# Patient Record
Sex: Female | Born: 1965 | Race: White | Hispanic: Yes | Marital: Married | Smoking: Former smoker
Health system: Southern US, Community
[De-identification: ages and names within clinical notes are randomized; demographics above are authoritative.]

## PROBLEM LIST (undated history)

## (undated) DIAGNOSIS — E079 Disorder of thyroid, unspecified: Secondary | ICD-10-CM

## (undated) DIAGNOSIS — R2230 Localized swelling, mass and lump, unspecified upper limb: Secondary | ICD-10-CM

## (undated) DIAGNOSIS — D649 Anemia, unspecified: Secondary | ICD-10-CM

## (undated) DIAGNOSIS — E041 Nontoxic single thyroid nodule: Secondary | ICD-10-CM

## (undated) DIAGNOSIS — M1990 Unspecified osteoarthritis, unspecified site: Secondary | ICD-10-CM

## (undated) DIAGNOSIS — K9041 Non-celiac gluten sensitivity: Secondary | ICD-10-CM

## (undated) HISTORY — DX: Anemia, unspecified: D64.9

## (undated) HISTORY — DX: Localized swelling, mass and lump, unspecified upper limb: R22.30

## (undated) HISTORY — DX: Non-celiac gluten sensitivity: K90.41

## (undated) HISTORY — DX: Disorder of thyroid, unspecified: E07.9

## (undated) HISTORY — PX: OTHER SURGICAL HISTORY: SHX169

## (undated) HISTORY — PX: TUBAL LIGATION: SHX77

## (undated) HISTORY — PX: TONSILLECTOMY: SUR1361

## (undated) HISTORY — DX: Unspecified osteoarthritis, unspecified site: M19.90

---

## 2005-08-24 ENCOUNTER — Other Ambulatory Visit: Admission: RE | Admit: 2005-08-24 | Discharge: 2005-08-24 | Payer: Self-pay | Admitting: Family Medicine

## 2005-08-24 ENCOUNTER — Ambulatory Visit: Payer: Self-pay | Admitting: Family Medicine

## 2005-09-22 ENCOUNTER — Ambulatory Visit: Payer: Self-pay | Admitting: Family Medicine

## 2006-10-30 ENCOUNTER — Encounter: Payer: Self-pay | Admitting: Family Medicine

## 2007-05-27 ENCOUNTER — Ambulatory Visit: Payer: Self-pay | Admitting: Family Medicine

## 2007-05-27 ENCOUNTER — Other Ambulatory Visit: Admission: RE | Admit: 2007-05-27 | Discharge: 2007-05-27 | Payer: Self-pay | Admitting: Family Medicine

## 2007-05-27 ENCOUNTER — Encounter: Payer: Self-pay | Admitting: Family Medicine

## 2007-05-27 DIAGNOSIS — R143 Flatulence: Secondary | ICD-10-CM

## 2007-05-27 DIAGNOSIS — R142 Eructation: Secondary | ICD-10-CM

## 2007-05-27 DIAGNOSIS — K5900 Constipation, unspecified: Secondary | ICD-10-CM | POA: Insufficient documentation

## 2007-05-27 DIAGNOSIS — R141 Gas pain: Secondary | ICD-10-CM | POA: Insufficient documentation

## 2007-05-27 DIAGNOSIS — J309 Allergic rhinitis, unspecified: Secondary | ICD-10-CM | POA: Insufficient documentation

## 2007-05-27 LAB — CONVERTED CEMR LAB
Blood in Urine, dipstick: NEGATIVE
Ketones, urine, test strip: NEGATIVE
Pap Smear: NORMAL
Protein, U semiquant: NEGATIVE
WBC Urine, dipstick: NEGATIVE

## 2007-05-31 ENCOUNTER — Encounter: Payer: Self-pay | Admitting: Family Medicine

## 2007-05-31 LAB — CONVERTED CEMR LAB
ALT: 13 units/L (ref 0–35)
CO2: 25 meq/L (ref 19–32)
Calcium: 8.8 mg/dL (ref 8.4–10.5)
Chloride: 104 meq/L (ref 96–112)
Cholesterol: 169 mg/dL (ref 0–200)
Glucose, Bld: 83 mg/dL (ref 70–99)
HCT: 32.1 % — ABNORMAL LOW (ref 36.0–46.0)
HDL: 71 mg/dL (ref >39)
Hemoglobin: 8.9 g/dL — ABNORMAL LOW (ref 12.0–15.0)
LDL Cholesterol: 86 mg/dL (ref 0–99)
MCHC: 27.7 g/dL — ABNORMAL LOW (ref 30.0–36.0)
Platelets: 556 10*3/uL — ABNORMAL HIGH (ref 150–400)
Potassium: 4.6 meq/L (ref 3.5–5.3)
RBC: 4.52 M/uL (ref 3.87–5.11)
RDW: 18.5 % — ABNORMAL HIGH (ref 11.5–14.0)
Sodium: 139 meq/L (ref 135–145)
T3, Free: 2.8 pg/mL (ref 2.3–4.2)
Total Protein: 7 g/dL (ref 6.0–8.3)
Triglycerides: 61 mg/dL (ref <150)
WBC: 4.8 10*3/uL (ref 4.0–10.5)

## 2007-06-03 ENCOUNTER — Encounter: Payer: Self-pay | Admitting: Family Medicine

## 2007-06-04 ENCOUNTER — Telehealth (INDEPENDENT_AMBULATORY_CARE_PROVIDER_SITE_OTHER): Payer: Self-pay | Admitting: *Deleted

## 2007-06-04 ENCOUNTER — Encounter: Payer: Self-pay | Admitting: Family Medicine

## 2007-06-04 LAB — CONVERTED CEMR LAB
Saturation Ratios: 4 % — ABNORMAL LOW (ref 20–55)
UIBC: 401 ug/dL

## 2007-07-17 ENCOUNTER — Telehealth: Payer: Self-pay | Admitting: Family Medicine

## 2007-07-17 DIAGNOSIS — D509 Iron deficiency anemia, unspecified: Secondary | ICD-10-CM | POA: Insufficient documentation

## 2007-08-19 ENCOUNTER — Encounter: Payer: Self-pay | Admitting: Family Medicine

## 2007-09-13 ENCOUNTER — Encounter: Payer: Self-pay | Admitting: Family Medicine

## 2007-09-13 LAB — HM COLONOSCOPY: HM Colonoscopy: NORMAL

## 2007-11-12 ENCOUNTER — Encounter: Payer: Self-pay | Admitting: Family Medicine

## 2008-02-05 ENCOUNTER — Encounter: Payer: Self-pay | Admitting: Family Medicine

## 2008-03-17 ENCOUNTER — Ambulatory Visit: Payer: Self-pay | Admitting: Family Medicine

## 2008-03-25 ENCOUNTER — Telehealth: Payer: Self-pay | Admitting: Family Medicine

## 2008-03-30 ENCOUNTER — Encounter: Payer: Self-pay | Admitting: Family Medicine

## 2008-04-07 LAB — CONVERTED CEMR LAB
CO2: 24 meq/L (ref 19–32)
Creatinine, Ser: 0.76 mg/dL (ref 0.40–1.20)
HDL: 78 mg/dL (ref >39)
MCV: 94.1 fL (ref 78.0–100.0)
Potassium: 4 meq/L (ref 3.5–5.3)
Total CHOL/HDL Ratio: 2.3
VLDL: 11 mg/dL (ref 0–40)
WBC: 5.9 10*3/uL (ref 4.0–10.5)

## 2008-05-25 ENCOUNTER — Ambulatory Visit: Payer: Self-pay | Admitting: Family Medicine

## 2008-05-25 DIAGNOSIS — R229 Localized swelling, mass and lump, unspecified: Secondary | ICD-10-CM | POA: Insufficient documentation

## 2008-05-25 DIAGNOSIS — M25559 Pain in unspecified hip: Secondary | ICD-10-CM | POA: Insufficient documentation

## 2008-05-27 ENCOUNTER — Encounter: Payer: Self-pay | Admitting: Family Medicine

## 2008-05-27 ENCOUNTER — Telehealth: Payer: Self-pay | Admitting: Family Medicine

## 2008-08-25 ENCOUNTER — Ambulatory Visit: Payer: Self-pay | Admitting: Family Medicine

## 2009-02-08 ENCOUNTER — Encounter: Payer: Self-pay | Admitting: Family Medicine

## 2009-10-11 ENCOUNTER — Telehealth: Payer: Self-pay | Admitting: Family Medicine

## 2009-10-11 ENCOUNTER — Encounter: Payer: Self-pay | Admitting: Family Medicine

## 2009-12-14 HISTORY — PX: HEMORRHOID SURGERY: SHX153

## 2010-04-20 LAB — HM MAMMOGRAPHY

## 2010-04-22 ENCOUNTER — Encounter: Payer: Self-pay | Admitting: Family Medicine

## 2010-05-26 ENCOUNTER — Ambulatory Visit: Payer: Self-pay | Admitting: Diagnostic Radiology

## 2010-05-26 ENCOUNTER — Ambulatory Visit: Payer: Self-pay | Admitting: Family

## 2010-05-26 ENCOUNTER — Ambulatory Visit (HOSPITAL_BASED_OUTPATIENT_CLINIC_OR_DEPARTMENT_OTHER): Admission: RE | Admit: 2010-05-26 | Discharge: 2010-05-26 | Payer: Self-pay | Admitting: Internal Medicine

## 2010-05-26 DIAGNOSIS — R5381 Other malaise: Secondary | ICD-10-CM | POA: Insufficient documentation

## 2010-05-26 DIAGNOSIS — R5383 Other fatigue: Secondary | ICD-10-CM | POA: Insufficient documentation

## 2010-05-30 ENCOUNTER — Encounter: Payer: Self-pay | Admitting: Family

## 2010-05-30 LAB — CONVERTED CEMR LAB
AST: 16 units/L (ref 0–37)
Albumin: 4.1 g/dL (ref 3.5–5.2)
Basophils Relative: 0 % (ref 0–1)
CO2: 25 meq/L (ref 19–32)
Calcium: 9 mg/dL (ref 8.4–10.5)
Eosinophils Relative: 5 % (ref 0–5)
Free T4: 1.19 ng/dL (ref 0.80–1.80)
HCT: 40.9 % (ref 36.0–46.0)
HDL: 87 mg/dL (ref >39)
Hemoglobin: 13.2 g/dL (ref 12.0–15.0)
LDL Cholesterol: 75 mg/dL (ref 0–99)
Lymphocytes Relative: 38 % (ref 12–46)
Lymphs Abs: 1.8 10*3/uL (ref 0.7–4.0)
MCHC: 32.3 g/dL (ref 30.0–36.0)
Monocytes Absolute: 0.4 10*3/uL (ref 0.1–1.0)
Monocytes Relative: 9 % (ref 3–12)
Neutro Abs: 2.2 10*3/uL (ref 1.7–7.7)
Neutrophils Relative %: 48 % (ref 43–77)
Potassium: 4.3 meq/L (ref 3.5–5.3)
RBC: 4.21 M/uL (ref 3.87–5.11)
Sodium: 140 meq/L (ref 135–145)
TSH: 1.543 microintl units/mL (ref 0.350–4.500)
Total Protein: 6.5 g/dL (ref 6.0–8.3)
VLDL: 7 mg/dL (ref 0–40)

## 2010-05-31 ENCOUNTER — Encounter: Payer: Self-pay | Admitting: Family

## 2010-12-13 NOTE — Assessment & Plan Note (Signed)
Summary: HIP PAIN//VGJ--rm 6   Vital Signs:  Patient profile:   45 year old female Height:      63.5 inches Weight:      142.25 pounds BMI:     24.89 Temp:     98.1 degrees F oral Pulse rate:   66 / minute Pulse rhythm:   regular Resp:     18 per minute BP sitting:   130 / 80  (right arm) Cuff size:   regular  Vitals Entered By: Mervin Kung CMA Duncan Dull) (May 26, 2010 10:45 AM) CC: Room 6   Left hip pain x 2 days.  Difficult to walk. Needs refills on : Protonix and Nasonex. Is Patient Diabetic? No   Primary Care Provider:  Linford Schroeder, Tracie Schroeder  CC:  Room 6   Left hip pain x 2 days.  Difficult to walk. Needs refills on : Protonix and Nasonex.Marland Kitchen  History of Present Illness: Tracie Schroeder is a 45 year old female who presents with complaint of bilateral hip pain which started yesterday morning.  By noon- only the left hip was hurting.  Pain was so severe that she could not walk last night.  Tried celebrex without relief.  Tried percocet at  bedtime which helped her to sleep.  Denies known injury. She notes that she often has mild hip pain which has been intermittent for several years.  Did the eliptical machine on saturday and sunday which was her normal routine.  Hip pain is less today.  Denies fever.    She also complains of fatigue.  She is requesting that we check her thyroid and vitamin D levels.   Allergies: 1)  ! * Almonds 2)  ! * Cold  Physical Exam  General:  Well-developed,well-nourished,in no acute distress; alert,appropriate and cooperative throughout examination Msk:  No tenderenss of either hip joint to palpation.  + pain with ROM of left hip.   + pain with weight bearing.  Gait is unsteady due to pain.     Impression & Recommendations:  Problem # 1:  HIP PAIN, LEFT (ICD-719.45) Assessment New Will check bilateral hip x-ray- (suspect bursitis)  Pt instructed to use ibuprofen as needed.  Will refer to ortho given duration of patient's symptoms.   Orders: T-DG Hip  Bilateral w/Pelvis (04540) Orthopedic Referral (Ortho)  Problem # 2:  FATIGUE (ICD-780.79) Assessment: New Patient instructed to return fasting for fasting labs at the [place] office and to schedule a complete physical with Dr. Linford Schroeder. Orders: T-TSH (98119-14782) T-Free T4 (23300)  Complete Medication List: 1)  Protonix 40 Mg Pack (Pantoprazole sodium) .... Take 1 tablet by mouth once a day 2)  Nasonex 50 Mcg/act Susp (Mometasone furoate) .... 2 sprays in each nostril daily 3)  Vitamin A 8000 Iu  .... Take 1 capsule by mouth once a day 4)  Colace 100 Mg Caps (Docusate sodium) .... Take 1 capsule by mouth two times a day 5)  Metamucil Wafr (Psyllium) .... As needed 6)  Iodine ? Tablet  .... Take 1 tablet by mouth once a day as needed. 7)  Acetyl L-carnitine / Alpha Lipoic Acid 400mg /200mg   .... Take 1 capsule by mouth two times a day 8)  Vitamin E 400 Unit Caps (Vitamin e) .... One cap by mouth daily 9)  Active-q 200-30 Mg Caps (Ubiquinol-vitamin Tracie Schroeder) .... One cap by mouth daily 10)  Colace 100 Mg Caps (Docusate sodium) .... One cap by mouth twice daily 11)  Acetyl L-carnitine 250 Mg Caps (Acetylcarnitine hcl) .Marland KitchenMarland KitchenMarland Kitchen  Two caps twice daily 12)  Iodine (kelp) 150mg  Tabs  .... One tab by mouth daily 13)  Alpha-lipoic Acid 200 Mg Caps (Alpha-lipoic acid) .... One cap by mouth daily  Other Orders: TLB-CMP (Comprehensive Metabolic Pnl) (80053-COMP) T-CBC w/Diff ([id number]) T-Assay of Vitamin D (14782-95621) T-Lipid Profile (30865-78469)  Patient Instructions: 1)  Please complete your x-ray downstairs.  2)  You will be contacted about your referral to Orthopedics. 3)  Please complete your blood work fasting. 4)  Call to arrange a complete physical with Dr. Linford Schroeder. Prescriptions: NASONEX 50 MCG/ACT  SUSP (MOMETASONE FUROATE) 2 sprays in each nostril daily  #1 x 2   Entered and Authorized by:   Tracie Schroeder   Signed by:   Tracie Schroeder on 05/26/2010    Method used:   Electronically to        [facility]* (retail)       [address] [place], [place]  [address]       Ph: [phone]       Fax: [phone]   RxID:   [id number] PROTONIX 40 MG  PACK (PANTOPRAZOLE SODIUM) Take 1 tablet by mouth once a day  #30.0 Each x 2   Entered and Authorized by:   Tracie Schroeder   Signed by:   Tracie Schroeder on 05/26/2010   Method used:   Electronically to        [facility]* (retail)       [address] [place], [place]  [address]       Ph: [phone]       Fax: [phone]   RxID:   [id number]   Current Allergies (reviewed today): ! * ALMONDS ! * COLD

## 2010-12-13 NOTE — Letter (Signed)
   [place] at [facility] [address] [place], [place]  [address]  [place] Phone: [phone]      May 31, 2010   Tracie Schroeder [address] [place], [place] [address]  RE:  LAB RESULTS  Dear  [name],  The following is an interpretation of your most recent lab tests.  Please take note of any instructions provided or changes to medications that have resulted from your lab work.  ELECTROLYTES:  Good - no changes needed  KIDNEY FUNCTION TESTS:  Good - no changes needed  LIVER FUNCTION TESTS:  Good - no changes needed  LIPID PANEL:  Good - no changes needed Triglyceride: 35   Cholesterol: 169   LDL: 75   HDL: 87   Chol/HDL%:  1.9 Ratio  THYROID STUDIES:  Thyroid studies normal TSH: 1.543     CBC:  Good - no changes needed Vitamin D level is low normal.  Please add vitamin D as noted below.  Please arrange follow up in 3 months with Dr. Linford Arnold and she can repeat your level at that time.   Medications Prescribed or Changed VITAMIN D 1000 UNIT CAPS (CHOLECALCIFEROL) one tablet by mouth daily   Sincerely Yours,    Lemont Fillers FNP  Appended Document:  Letter mailed.

## 2010-12-13 NOTE — Miscellaneous (Signed)
Summary: mammogram normal  Clinical Lists Changes  Observations: Added new observation of MAMMRECACT: Screening mammogram in 1 year.    (04/20/2010 12:48) Added new observation of MAMMOGRAM: Location: [facility].   No specific mammographic evidence of malignancy.  No significant changes compared to previous study.  Assessment: BIRADS 2.  (04/20/2010 12:48)      Mammogram  Procedure date:  04/20/2010  Findings:      Location: [facility].   No specific mammographic evidence of malignancy.  No significant changes compared to previous study.  Assessment: BIRADS 2.   Comments:      Screening mammogram in 1 year.      Mammogram  Procedure date:  04/20/2010  Findings:      Location: [facility].   No specific mammographic evidence of malignancy.  No significant changes compared to previous study.  Assessment: BIRADS 2.   Comments:      Screening mammogram in 1 year.

## 2011-03-11 ENCOUNTER — Other Ambulatory Visit: Payer: Self-pay | Admitting: Family

## 2011-03-13 NOTE — Telephone Encounter (Signed)
Pt last seen 05/26/10, has no future appointments on file. Please advise if ok to give refill.

## 2011-09-01 ENCOUNTER — Encounter: Payer: Self-pay | Admitting: Family Medicine

## 2011-09-07 ENCOUNTER — Encounter: Payer: Self-pay | Admitting: Family Medicine

## 2011-09-07 ENCOUNTER — Ambulatory Visit (INDEPENDENT_AMBULATORY_CARE_PROVIDER_SITE_OTHER): Payer: 59 | Admitting: Family Medicine

## 2011-09-07 ENCOUNTER — Other Ambulatory Visit (HOSPITAL_COMMUNITY)
Admission: RE | Admit: 2011-09-07 | Discharge: 2011-09-07 | Disposition: A | Discharge status: Home / Self Care | Payer: 59 | Source: Ambulatory Visit | Attending: Family Medicine | Admitting: Family Medicine

## 2011-09-07 VITALS — BP 131/89 | HR 80 | Wt 139.0 lb

## 2011-09-07 DIAGNOSIS — E049 Nontoxic goiter, unspecified: Secondary | ICD-10-CM

## 2011-09-07 DIAGNOSIS — K9041 Non-celiac gluten sensitivity: Secondary | ICD-10-CM

## 2011-09-07 DIAGNOSIS — Z01419 Encounter for gynecological examination (general) (routine) without abnormal findings: Secondary | ICD-10-CM

## 2011-09-07 DIAGNOSIS — K900 Celiac disease: Secondary | ICD-10-CM

## 2011-09-07 DIAGNOSIS — Z23 Encounter for immunization: Secondary | ICD-10-CM

## 2011-09-07 DIAGNOSIS — E010 Iodine-deficiency related diffuse (endemic) goiter: Secondary | ICD-10-CM

## 2011-09-07 DIAGNOSIS — Z1159 Encounter for screening for other viral diseases: Secondary | ICD-10-CM | POA: Insufficient documentation

## 2011-09-07 NOTE — Patient Instructions (Addendum)
Start a regular exercise program and make sure you are eating a healthy diet Try to eat 4 servings of dairy a day or take a calcium supplement (500mg  twice a day). Your vaccines are up to date.  We will call you with the thyroid US appt time

## 2011-09-07 NOTE — Progress Notes (Signed)
  Subjective:     Tracie Schroeder is a 45 y.o. female and is here for a comprehensive physical exam. The patient reports no problems.  History   Social History  . Marital Status: Married    Spouse Name: N/A    Number of Children: 2   . 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    Social History Main Topics  . Smoking status: Former Games developer  . Smokeless tobacco: Former Neurosurgeon    Quit date: 11/14/1991  . Alcohol Use: 1.0 oz/week    2 drink(s) per week     per week  . Drug Use: No  . Sexually Active: Yes -- Female partner(s)     physician, married, on [employer].   Other Topics Concern  . Not on file   Social History Narrative   Has a son and daughter.  Some exercise.     Health Maintenance  Topic Date Due  . Mammogram  05/03/2012  . Influenza Vaccine  08/13/2012  . Pap Smear  09/06/2014  . Tetanus/tdap  09/06/2021    The following portions of the patient's history were reviewed and updated as appropriate: allergies, current medications, past family history, past medical history, past social history and past surgical history.  Review of Systems A comprehensive review of systems was negative.   Objective:    BP 131/89  Pulse 80  Wt 139 lb (63.05 kg) General appearance: alert, cooperative and appears stated age Head: Normocephalic, without obvious abnormality, atraumatic Eyes: PEERLA, EOMi, conjunctiva are clear.  Ears: normal TM's and external ear canals both ears Nose: Nares normal. Septum midline. Mucosa normal. No drainage or sinus tenderness. Throat: lips, mucosa, and tongue normal; teeth and gums normal and thyromegaly Neck: no adenopathy, no carotid bruit, supple, symmetrical, trachea midline and thyroid not enlarged, symmetric, no tenderness/mass/nodules Back: symmetric, no curvature. ROM normal. No CVA tenderness. Lungs: clear to auscultation bilaterally Breasts: normal appearance, no masses or  tenderness Heart: regular rate and rhythm, S1, S2 normal, no murmur, click, rub or gallop Abdomen: soft, non-tender; bowel sounds normal; no masses,  no organomegaly Pelvic: cervix normal in appearance, external genitalia normal, no adnexal masses or tenderness, no cervical motion tenderness, rectovaginal septum normal, uterus normal size, shape, and consistency and vagina normal without discharge Extremities: extremities normal, atraumatic, no cyanosis or edema Pulses: 2+ and symmetric Skin: Skin color, texture, turgor normal. No rashes or lesions Lymph nodes: Cervical, supraclavicular, and axillary nodes normal. Neurologic: Grossly normal    Assessment:   Healthy female exam.   Plan:     See After Visit Summary for Counseling Recommendations  Start a regular exercise program and make sure you are eating a healthy diet Try to eat 4 servings of dairy a day or take a calcium supplement (500mg  twice a day). Your vaccines are up to date.  Will call with pap results.  Given Tdap today.   Thyromegaly - will check TSH and schedule for thyroid US. Mother with hyperthyroid

## 2011-09-11 LAB — LIPID PANEL
Total CHOL/HDL Ratio: 2.3 Ratio
Triglycerides: 54 mg/dL (ref <150)

## 2011-09-12 ENCOUNTER — Telehealth: Payer: Self-pay | Admitting: Family Medicine

## 2011-09-12 LAB — COMPLETE METABOLIC PANEL WITH GFR
AST: 16 U/L (ref 0–37)
Albumin: 3.8 g/dL (ref 3.5–5.2)
BUN: 12 mg/dL (ref 6–23)
Chloride: 107 mEq/L (ref 96–112)
GFR, Est African American: >90 mL/min (ref >90)
GFR, Est Non African American: >90 mL/min (ref >90)
Glucose, Bld: 88 mg/dL (ref 70–99)
Sodium: 141 mEq/L (ref 135–145)

## 2011-09-12 NOTE — Telephone Encounter (Signed)
Pt called and LM for triage nurse stating she was scheduled for [place] at [facility] and the time slots they have for appts is not working with her work schedule.  Pt wishes to schedule at [facility] the [place] and their scheduling # is [phone]. Plan:  Routed this encounter to the referral coordinator. Jarvis Newcomer, LPN Domingo Dimes

## 2011-09-13 ENCOUNTER — Telehealth: Payer: Self-pay | Admitting: *Deleted

## 2011-09-13 NOTE — Telephone Encounter (Signed)
Message copied by Wyline Beady on Wed Sep 13, 2011  2:37 PM ------      Message from: Nani Gasser D      Created: Tue Sep 12, 2011  5:45 PM       Complete metabolic panel is normal. Cholesterol looks fantastic. Recheck cholesterol in 2-3 years. Thyroid panel was completely normal. See if they have scheduled her for her ultrasound yet.

## 2011-09-13 NOTE — Telephone Encounter (Signed)
Spoke with pt's husband and gave him results. He's nott sure if they have scheduled her yet

## 2011-09-14 ENCOUNTER — Telehealth: Payer: Self-pay | Admitting: Family Medicine

## 2011-09-14 DIAGNOSIS — E041 Nontoxic single thyroid nodule: Secondary | ICD-10-CM

## 2011-09-14 NOTE — Telephone Encounter (Signed)
Chris with [facility] called with stat U/S report. The report finding is as follows:  U/S soft tissue neck /thyroid prominent nodules in thyroid isthmus and RT thyroid lobe.  Nodule RT lower lobe is predominantly cystic with murial nodule.  Consider fine needle aspiration.  Plan:  Routed this stat report to the provider. Jarvis Newcomer, LPN Domingo Dimes

## 2011-09-14 NOTE — Telephone Encounter (Signed)
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  It showed prominent nodules int eh thyroid isthmus and right thyroid lobe. Likely muli-nodular goiter. Recommend FNA of one of the lesion. The newer rec with [facility] is to have a FNA.  Usually this is through the radiology dept. If Ok with this then let me know and we can schedule.

## 2011-09-14 NOTE — Telephone Encounter (Signed)
Once again, pt is calling back because she called in and spoke with triage nurse on 09-12-11 and a message was sent to the referral coordinator that she needed to have her neck U/S scheduled at [facility] and not downstairs due to conflict with the times she could get scheduled and her work schedule and no one has got her U/S scheduled yet at the [facility]. Plan:  Pt transferred to the referral coordinator because pt is right and this should have been handled.  There is a documented note from 09-12-11. Jarvis Newcomer, LPN Domingo Dimes

## 2011-09-15 ENCOUNTER — Telehealth: Payer: Self-pay | Admitting: Family Medicine

## 2011-09-15 NOTE — Telephone Encounter (Signed)
LMOM

## 2011-09-15 NOTE — Telephone Encounter (Signed)
[name]/referrals notified to check for external order for FNA testing on this pt since more and likely the order probably would not go into her work Q since external order. Jarvis Newcomer, LPN Domingo Dimes

## 2011-09-15 NOTE — Telephone Encounter (Signed)
Pt informed of her U/S results.  Pt requested copies of her labs and U/S report.  Were able to mail the labs results but could not find the U/S report and informed the pt via mail of this.  Told her that when she went back for add'l testing at [facility] to ask them for a copy or pt may call our office back later to get.  The report was a called report yesterday so the written report wasn't find in the system yet.    Pt does want to have the add'l testing FNA.  Please schedule for the pt. Routed to Dr. Marlyne Beards, LPN Domingo Dimes'

## 2011-09-15 NOTE — Telephone Encounter (Signed)
I put in order. Can you let Victorino Dike now since it is an external order.

## 2011-09-15 NOTE — Telephone Encounter (Signed)
Message copied by Wyline Beady on Fri Sep 15, 2011 10:38 AM ------      Message from: Nani Gasser D      Created: Thu Sep 14, 2011  4:48 PM       Call pt: Pap is normal. Repeat in 3 years.

## 2011-10-02 ENCOUNTER — Telehealth: Payer: Self-pay | Admitting: Family Medicine

## 2011-10-02 NOTE — Telephone Encounter (Signed)
Call pt: thyroid bx looks consistant with benign nodules.  And since labs were normal recommend follow the thyroid. REc repeat US in 6 month to make sure nodules are stable.

## 2011-10-03 NOTE — Telephone Encounter (Signed)
Pt notified. KJ LPN 

## 2011-10-04 ENCOUNTER — Encounter: Payer: Self-pay | Admitting: Family Medicine

## 2011-11-09 ENCOUNTER — Encounter: Payer: Self-pay | Admitting: Family Medicine

## 2011-12-01 ENCOUNTER — Other Ambulatory Visit: Payer: Self-pay | Admitting: Family Medicine

## 2011-12-05 ENCOUNTER — Ambulatory Visit (INDEPENDENT_AMBULATORY_CARE_PROVIDER_SITE_OTHER): Payer: 59 | Admitting: Family Medicine

## 2011-12-05 DIAGNOSIS — Z23 Encounter for immunization: Secondary | ICD-10-CM

## 2011-12-05 NOTE — Progress Notes (Signed)
  Subjective:    Patient ID: Tracie Schroeder, female    DOB: 16-Jan-1966, 46 y.o.   MRN: [id number] TB skin test HPI    Review of Systems     Objective:   Physical Exam        Assessment & Plan:

## 2011-12-11 ENCOUNTER — Ambulatory Visit: Payer: 59

## 2011-12-12 ENCOUNTER — Ambulatory Visit: Payer: 59

## 2012-04-10 ENCOUNTER — Telehealth: Payer: Self-pay | Admitting: *Deleted

## 2012-04-10 NOTE — Telephone Encounter (Signed)
Pt is asking when she is due for her next mammogram and is asking about a referral to a surgeon about removal of a mass. Please advise.

## 2012-04-10 NOTE — Telephone Encounter (Signed)
She is due for mammogram after June 21. I don't remember about the mass. Asked her if It was a lipoma. If it is then we can just set her up with a general surgeon. I usually a [facility] in [place]. If she has a preference please let me know. Also remind her that she is due for her followup ultrasound of her thyroid sometime between June and December. We can schedule anytime that will work well for her schedule.

## 2012-04-10 NOTE — Telephone Encounter (Signed)
Unable to Lm on cell phone. LM on home phone for pt to return call.

## 2012-04-10 NOTE — Telephone Encounter (Signed)
Referral made 

## 2012-04-10 NOTE — Telephone Encounter (Signed)
Pt states that at her last visit you stated that it was mammary tissue and that you could refer her to a Careers adviser. States that she was told they could probably do it in office. States that it is in the armpit. She would like to have you to send the referral. States where you send her is where she will go.

## 2012-05-06 ENCOUNTER — Other Ambulatory Visit (INDEPENDENT_AMBULATORY_CARE_PROVIDER_SITE_OTHER): Payer: Self-pay | Admitting: Surgery

## 2012-05-06 ENCOUNTER — Ambulatory Visit (INDEPENDENT_AMBULATORY_CARE_PROVIDER_SITE_OTHER): Payer: PRIVATE HEALTH INSURANCE | Admitting: Surgery

## 2012-05-06 ENCOUNTER — Encounter (INDEPENDENT_AMBULATORY_CARE_PROVIDER_SITE_OTHER): Payer: Self-pay | Admitting: Surgery

## 2012-05-06 VITALS — BP 130/84 | HR 72 | Temp 98.2°F | Resp 14 | Ht 63.0 in | Wt 142.4 lb

## 2012-05-06 DIAGNOSIS — R229 Localized swelling, mass and lump, unspecified: Secondary | ICD-10-CM

## 2012-05-06 NOTE — Progress Notes (Signed)
General Surgery [facility]  Chief Complaint  Patient presents with  . Lipoma    new pt- eval axilla lipoma - referral from Dr. Nani Gasser    HISTORY: Patient is a 46 year old female referred by her primary care physician for evaluation of right axillary mass. This has been present for 2-3 years and has gradually increased in size. It causes intermittent discomfort. It has only involved the right axilla and she has had no symptoms on the left side. Patient has had an ultrasound performed 2 years ago. She has had no further diagnostic studies. Patient does have 2 children. She had no problems during her pregnancies. She presents today for evaluation and desire surgical excision for definitive diagnosis and for control of discomfort.  Past Medical History  Diagnosis Date  . Axillary lump     RT  . Anemia   . Arthritis   . Thyroid disease     multinodular goiter  . Gluten intolerance      Current Outpatient Prescriptions  Medication Sig Dispense Refill  . Cyanocobalamin (B-12) 2500 MCG TABS Take by mouth daily.      Marland Kitchen docusate sodium (COLACE) 100 MG capsule Take 100 mg by mouth 2 (two) times daily.        . mometasone (NASONEX) 50 MCG/ACT nasal spray Place 2 sprays into the nose daily.        . Multiple Vitamin (MULTIVITAMIN) capsule Take 1 capsule by mouth daily.      Marland Kitchen OVER THE COUNTER MEDICATION Thyroid balance      . pantoprazole (PROTONIX) 40 MG tablet TAKE ONE TABLET BY MOUTH DAILY  30 tablet  1  . Psyllium (METAMUCIL PO) Take by mouth daily.        Marland Kitchen Ubiquinol-Vitamin C (ACTIVE-Q) 200-30 MG CAPS Take 1 capsule by mouth daily.           No Known Allergies   Family History  Problem Relation Age of Onset  . Hypertension Father   . Kidney disease Father     agenesis  . Diabetes Father   . Cancer Paternal Aunt     breast  . Diabetes Maternal Grandmother   . Diabetes Maternal Grandfather   . Heart attack Paternal Uncle   . Stroke Paternal  Aunt   . Parkinsonism Paternal Aunt   . Thyroid disease Mother   . Thyroid disease Paternal Aunt      History   Social History  . Marital Status: Married    Spouse Name: N/A    Number of Children: 2   . 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    Social History Main Topics  . Smoking status: Former Smoker    Quit date: 11/14/1991  . Smokeless tobacco: Never Used  . Alcohol Use: 1.0 oz/week    2 drink(s) per week     per week  . Drug Use: No  . Sexually Active: Yes -- Female partner(s)     physician, married, on [employer].   Other Topics Concern  . None   Social History Narrative   Has a son and daughter.  Some exercise.       REVIEW OF SYSTEMS - PERTINENT POSITIVES ONLY: Intermittent pain, right axilla; gradual increase in size  EXAM: Filed Vitals:   05/06/12 1417  BP: 130/84  Pulse: 72  Temp: 98.2 F (36.8 C)  Resp: 14    HEENT: normocephalic;  pupils equal and reactive; sclerae clear; dentition good; mucous membranes moist NECK:  symmetric on extension; no palpable anterior or posterior cervical lymphadenopathy; no supraclavicular masses; no tenderness CHEST: clear to auscultation bilaterally without rales, rhonchi, or wheezes CARDIAC: regular rate and rhythm without significant murmur; peripheral pulses are full EXT:  non-tender without edema; no deformity; Right axilla with small soft tissue mass in the upper portion of the axilla and extending under the medial aspect of the upper arm. This measures approximately 3 x 3 x 1.5 cm in dimension. It has the consistency of dense fibrous adipose tissue versus ectopic breast tissue. This does not appear to be lymphadenopathy. There are no palpable lymph nodes. Left axilla is without palpable abnormality. No lymphadenopathy. NEURO: no gross focal deficits; no sign of tremor   LABORATORY RESULTS: See [facility] ([facility]) for most recent  results   RADIOLOGY RESULTS: See [facility] ([facility]pic) for most recent results   IMPRESSION: Right axillary mass, 3 x 3 x 1.5 cm, mildly symptomatic  PLAN: The patient and I discussed surgical excision of this mass. This could be performed as an outpatient procedure under sedation and local anesthesia. Specimen will be sent to pathology for review. We discussed the postoperative course to be anticipated. Patient understands and wishes to proceed.  The risks and benefits of the procedure have been discussed at length with the patient.  The patient understands the proposed procedure, potential alternative treatments, and the course of recovery to be expected.  All of the patient's questions have been answered at this time.  The patient wishes to proceed with surgery.  Velora Heckler, MD, FACS General & Endocrine Surgery [facility]   Visit Diagnoses: 1. MASS, RIGHT AXILLA     Primary Care Physician: Nani Gasser, MD

## 2012-06-20 ENCOUNTER — Encounter (HOSPITAL_BASED_OUTPATIENT_CLINIC_OR_DEPARTMENT_OTHER): Payer: Self-pay

## 2012-06-20 ENCOUNTER — Ambulatory Visit (HOSPITAL_BASED_OUTPATIENT_CLINIC_OR_DEPARTMENT_OTHER): Admit: 2012-06-20 | Payer: Self-pay | Admitting: Surgery

## 2012-06-20 SURGERY — EXCISION MASS
Anesthesia: Monitor Anesthesia Care | Laterality: Right

## 2012-08-12 ENCOUNTER — Telehealth: Payer: Self-pay | Admitting: *Deleted

## 2012-08-12 DIAGNOSIS — E041 Nontoxic single thyroid nodule: Secondary | ICD-10-CM

## 2012-08-12 NOTE — Telephone Encounter (Signed)
Pt states she had a biopsy a year ago but never had another [place]. States she was told by our office she needed another biopsy.

## 2012-08-12 NOTE — Telephone Encounter (Signed)
She only needs [place].  No one from our office called and said she needs another bx. I am not sure where that came from. I will place order for the [place] though.

## 2012-08-12 NOTE — Telephone Encounter (Signed)
Who told her that she needs a biopsy? Him very confused. I did order this on her. In fact she is Re: had a biopsy was supposed to have a repeat ultrasound. We may need to check into this a little further.

## 2012-08-12 NOTE — Telephone Encounter (Signed)
Pt states she is suppose to have a thyroid biopsy but wants to know if she can just have a thyroid US done instead. States if she can she would rather have it done a [facility]. Please advise.

## 2012-08-13 ENCOUNTER — Other Ambulatory Visit: Payer: Self-pay | Admitting: Family Medicine

## 2012-08-13 DIAGNOSIS — Z1231 Encounter for screening mammogram for malignant neoplasm of breast: Secondary | ICD-10-CM

## 2012-08-13 NOTE — Telephone Encounter (Signed)
Pt informed

## 2012-08-22 ENCOUNTER — Ambulatory Visit (INDEPENDENT_AMBULATORY_CARE_PROVIDER_SITE_OTHER): Payer: PRIVATE HEALTH INSURANCE

## 2012-08-22 DIAGNOSIS — Z1231 Encounter for screening mammogram for malignant neoplasm of breast: Secondary | ICD-10-CM

## 2012-08-22 DIAGNOSIS — E041 Nontoxic single thyroid nodule: Secondary | ICD-10-CM

## 2012-08-23 ENCOUNTER — Telehealth: Payer: Self-pay | Admitting: *Deleted

## 2012-08-23 DIAGNOSIS — E042 Nontoxic multinodular goiter: Secondary | ICD-10-CM

## 2012-08-23 NOTE — Telephone Encounter (Signed)
Pt would like to know if you will order thyroid function blood work. Please advise.

## 2012-08-23 NOTE — Telephone Encounter (Signed)
Lab slip printed. She can do any time.

## 2012-08-23 NOTE — Telephone Encounter (Signed)
Pt aware.

## 2012-09-02 LAB — T3, FREE: T3, Free: 3.4 pg/mL (ref 2.3–4.2)

## 2012-09-04 ENCOUNTER — Encounter: Payer: Self-pay | Admitting: *Deleted

## 2012-09-16 ENCOUNTER — Other Ambulatory Visit: Payer: Self-pay | Admitting: Family Medicine

## 2012-09-16 ENCOUNTER — Other Ambulatory Visit: Payer: Self-pay | Admitting: *Deleted

## 2012-09-16 MED ORDER — PANTOPRAZOLE SODIUM 40 MG PO TBEC: 40.0000 mg | DELAYED_RELEASE_TABLET | Freq: Every day | ORAL | Status: DC

## 2012-11-04 ENCOUNTER — Other Ambulatory Visit: Payer: Self-pay | Admitting: *Deleted

## 2012-11-04 MED ORDER — PANTOPRAZOLE SODIUM 40 MG PO TBEC: 40.0000 mg | DELAYED_RELEASE_TABLET | Freq: Every day | ORAL | Status: DC

## 2013-08-21 ENCOUNTER — Ambulatory Visit (INDEPENDENT_AMBULATORY_CARE_PROVIDER_SITE_OTHER): Payer: PRIVATE HEALTH INSURANCE | Admitting: Family Medicine

## 2013-08-21 ENCOUNTER — Encounter: Payer: Self-pay | Admitting: Family Medicine

## 2013-08-21 VITALS — BP 115/70 | HR 82 | Wt 140.0 lb

## 2013-08-21 DIAGNOSIS — M79602 Pain in left arm: Secondary | ICD-10-CM

## 2013-08-21 DIAGNOSIS — Z1231 Encounter for screening mammogram for malignant neoplasm of breast: Secondary | ICD-10-CM

## 2013-08-21 DIAGNOSIS — M79601 Pain in right arm: Secondary | ICD-10-CM

## 2013-08-21 DIAGNOSIS — E041 Nontoxic single thyroid nodule: Secondary | ICD-10-CM

## 2013-08-21 DIAGNOSIS — M79609 Pain in unspecified limb: Secondary | ICD-10-CM

## 2013-08-21 MED ORDER — PANTOPRAZOLE SODIUM 40 MG PO TBEC: 40.0000 mg | DELAYED_RELEASE_TABLET | Freq: Every day | ORAL | Status: DC

## 2013-08-21 MED ORDER — CELECOXIB 200 MG PO CAPS: 200.0000 mg | ORAL_CAPSULE | Freq: Two times a day (BID) | ORAL | Status: DC

## 2013-08-21 NOTE — Progress Notes (Signed)
  Subjective:    Patient ID: Tracie Schroeder, female    DOB: 03-14-1966, 47 y.o.   MRN: [id number]  HPI pain in neck and shoulder x 3 months that has gotten worse the pain goes down left arm.  causea a stabbing burning pain.  No numbness in the hands. It is a heaviness.  It sometimes affects her right arm gets better throughout the day she has used advil and aleve for pain and icy hot. Aleve helps some but upsets her stomach.   no injury or trauma.  Says it is worse at night when she sleeps. It will wake her up at night.    She is on a gluten free diet and says her hips have felt much better.    History of thyroid nodule-she wasn't sure when she's due for repeat exam. She plans on scheduling her mammogram soon. Review of Systems     Objective:   Physical Exam  Constitutional: She is oriented to person, place, and time. She appears well-developed and well-nourished.  HENT:  Head: Normocephalic and atraumatic.  Musculoskeletal:  Neck with NROM.  Neg spurlings.  Shoulder, elbows and wrists iwht NROM.  No tremor.  Shoulders, elbows and wrists with normal strength bilaterally. She did have some shakiness with resistance. Nontender at the shoulder, elbow or wrist. No joint swelling. Nontender over the upper arm or forearm. No significant discomfort in the forearm with pronation or supination of the hand against resistance.  Neurological: She is alert and oriented to person, place, and time.  Skin: Skin is warm and dry.  Psychiatric: She has a normal mood and affect. Her behavior is normal.          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 I would like to start with an x-ray. Also will switch her to Celebrex which should be marginal in the stomach but also provide some antibiotic related since she did get some improvement with Aleve. She currently sleeps on her back. Interestingly, her brother recently had to have a cervical  discectomy. Also consider a round of prednisone if not responding to the anti-inflammatory. We'll call with the results once available.  Thyroid nodule-last in September 2013. She's due to repeat exam to followup on the nodule. She did mention to me that she took a round of T3. She did feel better on it and had more energy. She is now off of it.

## 2013-08-22 ENCOUNTER — Ambulatory Visit (INDEPENDENT_AMBULATORY_CARE_PROVIDER_SITE_OTHER): Payer: PRIVATE HEALTH INSURANCE

## 2013-08-22 DIAGNOSIS — M79601 Pain in right arm: Secondary | ICD-10-CM

## 2013-08-22 DIAGNOSIS — M5380 Other specified dorsopathies, site unspecified: Secondary | ICD-10-CM

## 2013-08-25 ENCOUNTER — Other Ambulatory Visit: Payer: Self-pay | Admitting: *Deleted

## 2013-08-25 MED ORDER — CYCLOBENZAPRINE HCL 10 MG PO TABS: ORAL_TABLET | ORAL | Status: DC

## 2013-08-25 MED ORDER — MELOXICAM 7.5 MG PO TABS: 7.5000 mg | ORAL_TABLET | Freq: Two times a day (BID) | ORAL | Status: DC | PRN

## 2013-09-04 ENCOUNTER — Ambulatory Visit (INDEPENDENT_AMBULATORY_CARE_PROVIDER_SITE_OTHER): Payer: PRIVATE HEALTH INSURANCE

## 2013-09-04 DIAGNOSIS — E041 Nontoxic single thyroid nodule: Secondary | ICD-10-CM

## 2013-09-04 DIAGNOSIS — Z1231 Encounter for screening mammogram for malignant neoplasm of breast: Secondary | ICD-10-CM

## 2013-09-05 ENCOUNTER — Other Ambulatory Visit: Payer: Self-pay | Admitting: Family Medicine

## 2013-09-05 DIAGNOSIS — Z13220 Encounter for screening for lipoid disorders: Secondary | ICD-10-CM

## 2013-09-05 DIAGNOSIS — E041 Nontoxic single thyroid nodule: Secondary | ICD-10-CM

## 2013-09-09 LAB — LIPID PANEL
Cholesterol: 184 mg/dL (ref 0–200)
Total CHOL/HDL Ratio: 2.2 Ratio
Triglycerides: 71 mg/dL (ref <150)
VLDL: 14 mg/dL (ref 0–40)

## 2013-09-09 LAB — COMPLETE METABOLIC PANEL WITH GFR
ALT: 11 U/L (ref 0–35)
AST: 15 U/L (ref 0–37)
Alkaline Phosphatase: 36 U/L — ABNORMAL LOW (ref 39–117)
GFR, Est African American: >89 mL/min
GFR, Est Non African American: 87 mL/min
Glucose, Bld: 91 mg/dL (ref 70–99)
Potassium: 3.8 mEq/L (ref 3.5–5.3)
Sodium: 137 mEq/L (ref 135–145)
Total Bilirubin: 1 mg/dL (ref 0.3–1.2)
Total Protein: 6.7 g/dL (ref 6.0–8.3)

## 2013-09-10 LAB — PTH, INTACT AND CALCIUM
Calcium: 9.3 mg/dL (ref 8.4–10.5)
PTH: 27.5 pg/mL (ref 14.0–72.0)

## 2013-12-19 ENCOUNTER — Other Ambulatory Visit: Payer: Self-pay | Admitting: *Deleted

## 2013-12-19 ENCOUNTER — Telehealth: Payer: Self-pay | Admitting: *Deleted

## 2013-12-19 MED ORDER — OSELTAMIVIR PHOSPHATE 75 MG PO CAPS: 75.0000 mg | ORAL_CAPSULE | Freq: Two times a day (BID) | ORAL | Status: DC

## 2013-12-19 NOTE — Telephone Encounter (Signed)
Pt called and stated that her husband was tested positive for flu. She is asking for rx for tamiflu. rx sent.Tracie Schroeder

## 2014-01-23 ENCOUNTER — Ambulatory Visit (INDEPENDENT_AMBULATORY_CARE_PROVIDER_SITE_OTHER): Payer: PRIVATE HEALTH INSURANCE | Admitting: Physician Assistant

## 2014-01-23 ENCOUNTER — Encounter: Payer: Self-pay | Admitting: Physician Assistant

## 2014-01-23 VITALS — BP 121/85 | HR 86

## 2014-01-23 DIAGNOSIS — G629 Polyneuropathy, unspecified: Secondary | ICD-10-CM

## 2014-01-23 DIAGNOSIS — M79602 Pain in left arm: Secondary | ICD-10-CM

## 2014-01-23 DIAGNOSIS — G589 Mononeuropathy, unspecified: Secondary | ICD-10-CM

## 2014-01-23 MED ORDER — GABAPENTIN 300 MG PO CAPS: ORAL_CAPSULE | ORAL | Status: DC

## 2014-01-23 MED ORDER — METHYLPREDNISOLONE SODIUM SUCC 125 MG IJ SOLR
125.0000 mg | Freq: Once | INTRAMUSCULAR | Status: AC
  Administered 2014-01-23: 125 mg via INTRAMUSCULAR

## 2014-01-23 MED ORDER — PREDNISONE 50 MG PO TABS
ORAL_TABLET | ORAL | Status: DC
Start: 2014-01-23 — End: 2014-06-16

## 2014-01-23 NOTE — Patient Instructions (Addendum)
Will get EMGs.  Start gabapentin at bedtime nightly.  Prednisone burst for 5 days.

## 2014-01-25 NOTE — Progress Notes (Signed)
   Subjective:    Patient ID: Tracie Schroeder, female    DOB: 12-20-1965, 48 y.o.   MRN: [id number]  HPI Pt is a 48 yo female who presents to the clinic to follow up on arm pain. She was seen by Dr. Madilyn Fireman in October 2014 with bilateral arm pain that was suspected to be related to cervical disc herniation. She had xrays but no MRI of neck. Xray was largely normal. There was some mild spurring of C3 and C4. Pain has sinced resolved in right arm and only present in left wrist and forearm. There is some radiation into last 3 metatarsals of left hand. No radiation up. No neck pain or stiffness. That has significantly improved. Left wrist/arm pain is worse after using the phone all day and working. Better with rest. Mobic seems to help some. Flexeril no relief.    Review of Systems     Objective:   Physical Exam  Constitutional: She is oriented to person, place, and time. She appears well-developed and well-nourished.  HENT:  Head: Normocephalic and atraumatic.  Cardiovascular: Normal rate, regular rhythm and normal heart sounds.   Musculoskeletal:  Strength of left arm 5/5, hand grip 5/5, ROM full and without pain. Negative tinels. NO Pain with palpation over what seems to be ulnar nerve pathway on the medial forearm but pt reports that to be the pathway of deep pain.  Neurological: She is alert and oriented to person, place, and time.  Skin: Skin is dry.  Psychiatric: She has a normal mood and affect. Her behavior is normal.          Assessment & Plan:  Left arm pain/neuropathy- still cannot rule out cervical radiculopathy cause for pain. Will consider MRI however since pain  Resolved in right arm and xrays not very suggestive of herniation suspicious of something else lower in the nerve pathway impinging the nerve. I would like to get EMGs of left arm. ,minimal relief from NSAID and muscle relaxer. Will give solumedrol 125mg  in office today with prednisone burst. Would like to start  neurontin 300mg  at bedtime to see if will give some relief. Follow up with metheney after EMGs.

## 2014-01-30 ENCOUNTER — Telehealth: Payer: Self-pay | Admitting: *Deleted

## 2014-01-30 MED ORDER — MELOXICAM 7.5 MG PO TABS: 7.5000 mg | ORAL_TABLET | Freq: Two times a day (BID) | ORAL | Status: DC | PRN

## 2014-01-30 MED ORDER — CYCLOBENZAPRINE HCL 10 MG PO TABS: ORAL_TABLET | ORAL | Status: DC

## 2014-01-30 NOTE — Telephone Encounter (Signed)
Pt called and stated that the prednisone caused weight gain and hand swelling. she would like to be switched to mobic 15 mg daily (this is cheaper for her) and flexiril 10 mg at night instead. She states it is hard for her to loose the weight . Please advise.Tracie Schroeder [place]

## 2014-01-30 NOTE — Telephone Encounter (Signed)
Pt informed.Tracie Schroeder Tracie Schroeder  

## 2014-01-30 NOTE — Telephone Encounter (Signed)
OK sent over both to pharmacy.  I hope she is feeling better.

## 2014-04-23 ENCOUNTER — Telehealth: Payer: Self-pay | Admitting: *Deleted

## 2014-04-23 NOTE — Telephone Encounter (Signed)
Pt called and lvm asking about EMG studies. I returned her call and she did not have a vm set up.Tracie Schroeder

## 2014-04-24 NOTE — Telephone Encounter (Signed)
No vm set up.Tracie Schroeder [place]  

## 2014-04-28 NOTE — Telephone Encounter (Signed)
No vm.Tracie Schroeder, Tracie Schroeder  

## 2014-04-30 ENCOUNTER — Telehealth: Payer: Self-pay | Admitting: Family Medicine

## 2014-04-30 NOTE — Telephone Encounter (Signed)
FYI. Re: Referral for EMG studies.  I spoke with Adela Lank at [facility] and she has asked that Ms. Harvest Dark contact their office. Message has been passed on to patient.

## 2014-05-11 ENCOUNTER — Other Ambulatory Visit: Payer: Self-pay | Admitting: *Deleted

## 2014-05-11 MED ORDER — CYCLOBENZAPRINE HCL 10 MG PO TABS: ORAL_TABLET | ORAL | Status: DC

## 2014-05-11 NOTE — Telephone Encounter (Signed)
As per Luvenia Starch 20 pills were sent in and she said that if she required more refills she would need to be seen first. Margette Fast, [place]

## 2014-05-27 ENCOUNTER — Telehealth: Payer: Self-pay | Admitting: *Deleted

## 2014-05-27 NOTE — Telephone Encounter (Signed)
Pt called for her nerve conduction study results.Tracie Schroeder [place]

## 2014-05-28 NOTE — Telephone Encounter (Signed)
Last note I have is from Dr. Madilyn Fireman in June. I do not have results at this time and can not find in chart.        FYI. Re: Referral for EMG studies. I spoke with Adela Lank at [facility] and she has asked that Ms. Tracie Schroeder contact their office. Message has been passed on to patient.    Can we contact [place] [facility] and get copy of results.

## 2014-06-10 NOTE — Telephone Encounter (Signed)
Report is in Dr Gardiner Ramus in box. Patient is aware the report is aware the report is negative, per Luvenia Starch, and is waiting for Dr Gardiner Ramus recommendation.

## 2014-06-11 ENCOUNTER — Encounter: Payer: Self-pay | Admitting: Sports Medicine

## 2014-06-11 DIAGNOSIS — M5412 Radiculopathy, cervical region: Secondary | ICD-10-CM | POA: Insufficient documentation

## 2014-06-11 NOTE — Telephone Encounter (Signed)
Got report from Neurology: The overall EMG study was normal. But doesn't completely rule out an ulnar neuritis. I actually had our sports medicine doctor look over your report in addition to your x-ray and he thinks he could be helpful for you. I would like to try to get you in with him. His name is Dr. Aundria Mems. He is here in our office.

## 2014-06-11 NOTE — Telephone Encounter (Signed)
Patient advised and scheduled.  

## 2014-06-16 ENCOUNTER — Telehealth: Payer: Self-pay | Admitting: *Deleted

## 2014-06-16 ENCOUNTER — Ambulatory Visit (INDEPENDENT_AMBULATORY_CARE_PROVIDER_SITE_OTHER): Payer: PRIVATE HEALTH INSURANCE | Admitting: Sports Medicine

## 2014-06-16 ENCOUNTER — Encounter: Payer: Self-pay | Admitting: Sports Medicine

## 2014-06-16 VITALS — BP 144/87 | HR 96 | Ht 64.0 in | Wt 137.0 lb

## 2014-06-16 DIAGNOSIS — R209 Unspecified disturbances of skin sensation: Secondary | ICD-10-CM

## 2014-06-16 DIAGNOSIS — R202 Paresthesia of skin: Principal | ICD-10-CM

## 2014-06-16 DIAGNOSIS — R200 Anesthesia of skin: Secondary | ICD-10-CM

## 2014-06-16 MED ORDER — MELOXICAM 15 MG PO TABS: ORAL_TABLET | ORAL | Status: DC

## 2014-06-16 MED ORDER — GABAPENTIN 300 MG PO CAPS: ORAL_CAPSULE | ORAL | Status: DC

## 2014-06-16 NOTE — Assessment & Plan Note (Signed)
Symptoms and signs are highly suggestive of a left cervical radiculopathy especially considering mid and lower cervical degenerative changes on x-ray. She also prefers the arm in an abducted and externally rotated position which is typically preferred in patients with cervical radiculopathy, patients with cubital tunnel syndrome prefer to keep the elbow straight. Gabapentin, continue Flexeril, continue meloxicam. Physical therapy. MRI, return to go over results. This will likely still lead to an epidural injection.

## 2014-06-16 NOTE — Progress Notes (Signed)
   Subjective:    I'm seeing this patient as a consultation for: Iran Planas, PA-C  CC: Arm and hand pain  HPI: Patient states that one year ago, she had bilateral arm pain coming down her dorsal forearms and into her pinkies and the first half of the ring finger. The pain then localized to the left arm. It is burning in nature and ranges from a 2-7/10. She says that she is not aware of anything that makes it better, but that changing pillows will sometimes make it worse. She says that meloxicam and flexoril have helped a little, but have not provided complete relief. Symptoms are moderate, persistent.  Past medical history, Surgical history, Family history not pertinant except as noted below, Social history, Allergies, and medications have been entered into the medical record, reviewed, and no changes needed.   Review of Systems: No headache, visual changes, nausea, vomiting, diarrhea, constipation, dizziness, abdominal pain, skin rash, fevers, chills, night sweats, weight loss, swollen lymph nodes, body aches, joint swelling, muscle aches, chest pain, shortness of breath, mood changes, visual or auditory hallucinations.   Objective:   General: Well Developed, well nourished, and in no acute distress.  Neuro/Psych: Alert and oriented x3, extra-ocular muscles intact, able to move all 4 extremities, sensation grossly intact. Skin: Warm and dry, no rashes noted.  Respiratory: Not using accessory muscles, speaking in full sentences, trachea midline.  Cardiovascular: Pulses palpable, no extremity edema. Abdomen: Does not appear distended. Neck: Negative spurlings,  negative Tinnel's in cubital fossa,  Compression of canal of guyon does not reproduce symptoms,  No change with flexion and extension of elbow,  Strength is 5/5 for grip, finger spread, extension and flexion of fingers and at wrist. Some relief with putting her hands on the back of her head (abduction and external rotation of the  shoulder).  Impression and Recommendations:    The pain is localized in an ulnar nerve distribution. The pain began bilaterally, suggesting neck pathology. The fact that her pain is relieved by placing her hand on the back of her head also suggests cervical pathology. We're prescribing gabapentin for the pain, giving mobic and physical therapy and scheduling an MRI to evaluate the discs.

## 2014-06-16 NOTE — Telephone Encounter (Signed)
No prior auth required per Connye Burkitt. @ [place], CMA

## 2014-06-17 ENCOUNTER — Ambulatory Visit (INDEPENDENT_AMBULATORY_CARE_PROVIDER_SITE_OTHER): Payer: PRIVATE HEALTH INSURANCE

## 2014-06-17 ENCOUNTER — Other Ambulatory Visit: Payer: PRIVATE HEALTH INSURANCE

## 2014-06-17 DIAGNOSIS — M47812 Spondylosis without myelopathy or radiculopathy, cervical region: Secondary | ICD-10-CM

## 2014-06-17 DIAGNOSIS — M5020 Other cervical disc displacement, unspecified cervical region: Secondary | ICD-10-CM

## 2014-06-17 DIAGNOSIS — R200 Anesthesia of skin: Secondary | ICD-10-CM

## 2014-06-17 DIAGNOSIS — R202 Paresthesia of skin: Principal | ICD-10-CM

## 2014-06-22 ENCOUNTER — Other Ambulatory Visit: Payer: Self-pay | Admitting: Family Medicine

## 2014-07-02 ENCOUNTER — Encounter: Payer: Self-pay | Admitting: Sports Medicine

## 2014-07-02 ENCOUNTER — Ambulatory Visit (INDEPENDENT_AMBULATORY_CARE_PROVIDER_SITE_OTHER): Payer: PRIVATE HEALTH INSURANCE | Admitting: Sports Medicine

## 2014-07-02 VITALS — BP 151/90 | HR 90 | Ht 64.0 in | Wt 141.0 lb

## 2014-07-02 DIAGNOSIS — M5412 Radiculopathy, cervical region: Secondary | ICD-10-CM

## 2014-07-02 NOTE — Assessment & Plan Note (Signed)
Clinically represents a left C8 radiculitis, she does have a C6-C7 disc protrusion, broad-based, but distant we are going to proceed with a left-sided C6-C7 interlaminar epidural. Return to see me 2 weeks after the injection to evaluate her response.

## 2014-07-02 NOTE — Progress Notes (Signed)
  Subjective:    CC: Follow up  HPI: This is a very pleasant 48 year old female, she has persistent left-sided C. radicular symptoms, more recently we obtain an MRI for interventional injection, she is here to go over the results.  Past medical history, Surgical history, Family history not pertinant except as noted below, Social history, Allergies, and medications have been entered into the medical record, reviewed, and no changes needed.   Review of Systems: No fevers, chills, night sweats, weight loss, chest pain, or shortness of breath.   Objective:    General: Well Developed, well nourished, and in no acute distress.  Neuro: Alert and oriented x3, extra-ocular muscles intact, sensation grossly intact.  HEENT: Normocephalic, atraumatic, pupils equal round reactive to light, neck supple, no masses, no lymphadenopathy, thyroid nonpalpable.  Skin: Warm and dry, no rashes. Cardiac: Regular rate and rhythm, no murmurs rubs or gallops, no lower extremity edema.  Respiratory: Clear to auscultation bilaterally. Not using accessory muscles, speaking in full sentences.  MRI was personally reviewed and does show C6-C7 disc protrusion, broad-based with mild bilateral foraminal components.  Impression and Recommendations:

## 2014-07-13 ENCOUNTER — Other Ambulatory Visit: Payer: PRIVATE HEALTH INSURANCE

## 2014-07-22 ENCOUNTER — Other Ambulatory Visit: Payer: Self-pay | Admitting: Physician Assistant

## 2014-07-23 ENCOUNTER — Other Ambulatory Visit: Payer: Self-pay | Admitting: Physician Assistant

## 2014-07-24 ENCOUNTER — Telehealth: Payer: Self-pay | Admitting: *Deleted

## 2014-07-24 NOTE — Telephone Encounter (Signed)
Flexeril refilled yesterday to [facility] pharmacy. Margette Fast, CMA

## 2014-07-28 ENCOUNTER — Ambulatory Visit
Admission: RE | Admit: 2014-07-28 | Discharge: 2014-07-28 | Disposition: A | Discharge status: Home / Self Care | Payer: PRIVATE HEALTH INSURANCE | Source: Ambulatory Visit | Attending: Sports Medicine | Admitting: Sports Medicine

## 2014-07-28 MED ORDER — IOHEXOL 300 MG/ML  SOLN
1.0000 mL | Freq: Once | INTRAMUSCULAR | Status: AC | PRN
  Administered 2014-07-28: 1 mL via EPIDURAL

## 2014-07-28 MED ORDER — TRIAMCINOLONE ACETONIDE 40 MG/ML IJ SUSP (RADIOLOGY)
60.0000 mg | Freq: Once | INTRAMUSCULAR | Status: AC
  Administered 2014-07-28: 60 mg via EPIDURAL

## 2014-07-28 NOTE — Discharge Instructions (Signed)

## 2014-08-03 ENCOUNTER — Ambulatory Visit (INDEPENDENT_AMBULATORY_CARE_PROVIDER_SITE_OTHER): Payer: PRIVATE HEALTH INSURANCE | Admitting: Sports Medicine

## 2014-08-03 ENCOUNTER — Ambulatory Visit (INDEPENDENT_AMBULATORY_CARE_PROVIDER_SITE_OTHER): Payer: PRIVATE HEALTH INSURANCE

## 2014-08-03 ENCOUNTER — Encounter: Payer: Self-pay | Admitting: Sports Medicine

## 2014-08-03 VITALS — BP 144/91 | HR 91 | Ht 64.0 in | Wt 136.0 lb

## 2014-08-03 DIAGNOSIS — M5412 Radiculopathy, cervical region: Secondary | ICD-10-CM

## 2014-08-03 DIAGNOSIS — M4040 Postural lordosis, site unspecified: Secondary | ICD-10-CM

## 2014-08-03 MED ORDER — TRAMADOL HCL 50 MG PO TABS: ORAL_TABLET | ORAL | Status: DC

## 2014-08-03 MED ORDER — PREDNISONE (PAK) 10 MG PO TABS: ORAL_TABLET | ORAL | Status: DC
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
## 2014-08-04 ENCOUNTER — Encounter: Payer: Self-pay | Admitting: Family Medicine

## 2014-08-15 ENCOUNTER — Other Ambulatory Visit: Payer: Self-pay | Admitting: Family Medicine

## 2014-09-01 ENCOUNTER — Ambulatory Visit (INDEPENDENT_AMBULATORY_CARE_PROVIDER_SITE_OTHER): Payer: PRIVATE HEALTH INSURANCE | Admitting: Sports Medicine

## 2014-09-01 ENCOUNTER — Encounter: Payer: Self-pay | Admitting: Sports Medicine

## 2014-09-01 VITALS — BP 147/90 | HR 116 | Ht 64.0 in | Wt 132.0 lb

## 2014-09-01 DIAGNOSIS — M5412 Radiculopathy, cervical region: Secondary | ICD-10-CM

## 2014-09-01 NOTE — Assessment & Plan Note (Signed)
Resolved pain. Still with only mild numbness and tingling in the left hand. Overall 70% better. Declines amitriptyline and gabapentin. Repeat epidural. Return in one month.
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
## 2014-09-17 ENCOUNTER — Ambulatory Visit
Admission: RE | Admit: 2014-09-17 | Discharge: 2014-09-17 | Disposition: A | Discharge status: Home / Self Care | Payer: Self-pay | Source: Ambulatory Visit | Attending: Sports Medicine | Admitting: Sports Medicine

## 2014-09-17 MED ORDER — TRIAMCINOLONE ACETONIDE 40 MG/ML IJ SUSP (RADIOLOGY)
60.0000 mg | Freq: Once | INTRAMUSCULAR | Status: AC
  Administered 2014-09-17: 60 mg via EPIDURAL

## 2014-09-17 MED ORDER — IOHEXOL 300 MG/ML  SOLN
1.0000 mL | Freq: Once | INTRAMUSCULAR | Status: AC | PRN
  Administered 2014-09-17: 1 mL via EPIDURAL

## 2014-09-17 NOTE — Discharge Instructions (Signed)

## 2014-10-22 ENCOUNTER — Encounter: Payer: Self-pay | Admitting: Sports Medicine

## 2014-10-22 ENCOUNTER — Ambulatory Visit (INDEPENDENT_AMBULATORY_CARE_PROVIDER_SITE_OTHER): Payer: PRIVATE HEALTH INSURANCE | Admitting: Sports Medicine

## 2014-10-22 DIAGNOSIS — M5412 Radiculopathy, cervical region: Secondary | ICD-10-CM

## 2014-10-22 MED ORDER — AMITRIPTYLINE HCL 10 MG PO TABS: ORAL_TABLET | ORAL | Status: DC

## 2014-10-22 NOTE — Progress Notes (Signed)
  Subjective:    CC: Follow-up after cervical epidural  HPI: Left cervical radiculitis: Greatly improved epidural #2, continues to do very well, no pain, just a small amount of paresthesias. Happy with results so far. She is amenable to trying a medication at bedtime when her symptoms are the worst, and requests another epidural. She is not yet radiating consider surgical intervention or even discussed this with the surgeon.  Past medical history, Surgical history, Family history not pertinant except as noted below, Social history, Allergies, and medications have been entered into the medical record, reviewed, and no changes needed.   Review of Systems: No fevers, chills, night sweats, weight loss, chest pain, or shortness of breath.   Objective:    General: Well Developed, well nourished, and in no acute distress.  Neuro: Alert and oriented x3, extra-ocular muscles intact, sensation grossly intact.  HEENT: Normocephalic, atraumatic, pupils equal round reactive to light, neck supple, no masses, no lymphadenopathy, thyroid nonpalpable.  Skin: Warm and dry, no rashes. Cardiac: Regular rate and rhythm, no murmurs rubs or gallops, no lower extremity edema.  Respiratory: Clear to auscultation bilaterally. Not using accessory muscles, speaking in full sentences. Neck: Negative spurling's Full neck range of motion Grip strength and sensation normal in bilateral hands Strength good C4 to T1 distribution No sensory change to C4 to T1 Reflexes normal  Impression and Recommendations:

## 2014-10-22 NOTE — Assessment & Plan Note (Signed)
With negative NCS and EMG. Good response to cervical epidural number 2. Repeating epidural, this will be number 3. Adding amitryptiline. Return in 1 month.

## 2014-11-25 ENCOUNTER — Other Ambulatory Visit: Payer: Self-pay | Admitting: *Deleted

## 2014-11-25 MED ORDER — PANTOPRAZOLE SODIUM 40 MG PO TBEC: DELAYED_RELEASE_TABLET | ORAL | Status: DC

## 2014-11-26 ENCOUNTER — Ambulatory Visit: Payer: PRIVATE HEALTH INSURANCE | Admitting: Sports Medicine

## 2015-07-20 ENCOUNTER — Ambulatory Visit (INDEPENDENT_AMBULATORY_CARE_PROVIDER_SITE_OTHER): Payer: PRIVATE HEALTH INSURANCE | Admitting: Family Medicine

## 2015-07-20 ENCOUNTER — Ambulatory Visit (INDEPENDENT_AMBULATORY_CARE_PROVIDER_SITE_OTHER): Payer: PRIVATE HEALTH INSURANCE

## 2015-07-20 ENCOUNTER — Encounter: Payer: Self-pay | Admitting: Family Medicine

## 2015-07-20 VITALS — BP 138/84 | HR 71 | Wt 145.0 lb

## 2015-07-20 DIAGNOSIS — M25511 Pain in right shoulder: Secondary | ICD-10-CM

## 2015-07-20 DIAGNOSIS — M79641 Pain in right hand: Secondary | ICD-10-CM

## 2015-07-20 MED ORDER — DICLOFENAC SODIUM 1 % TD GEL: 2.0000 g | Freq: Four times a day (QID) | TRANSDERMAL | Status: DC

## 2015-07-20 NOTE — Assessment & Plan Note (Signed)
Unclear etiology. X-ray pending. Suspect synovitis. Treat with Aleve and topical diclofenac. Additionally fourth and fifth digits were buddy taped today.

## 2015-07-20 NOTE — Progress Notes (Signed)
Quick Note:  Xray of hand looks normal. ______

## 2015-07-20 NOTE — Progress Notes (Signed)
Tracie Schroeder is a 49 y.o. female who presents to [facility]: [facility]  today for right shoulder and right hand pain.  1) right shoulder pain present for a few weeks. Patient has pain in the anterior to lateral upper arm present with overhand motion reaching back. Additionally this pain is present at night. She cannot recall any specific injury. She's had similar pain in the left side before. She denies any significant pain radiating to her hands or arms. No weakness or numbness or loss of function. She has tried Aleve which has helped. Patient states she has a history of cervical radiculopathy to her left side however her current symptoms are not consistent with the previous symptoms.  2) right hand pain. Patient notes a few weeks of pain in the right fifth MCP and PIP. Pain worsened over the last few days. She notes she did a fair amount of gardening about 2 weeks ago but denies any specific injury. She has pain with flexion and extension. No radiating pain weakness or numbness. She denies any significant swelling. She has tried Aleve which helps.   Past Medical History  Diagnosis Date  . Axillary lump     RT  . Anemia   . Arthritis   . Thyroid disease     multinodular goiter  . Gluten intolerance    Past Surgical History  Procedure Laterality Date  . Tonsillectomy    . Tubal ligation    . Uterine ablation    . Hemorrhoid surgery  12/2009   Social History  Substance Use Topics  . Smoking status: Former Smoker    Quit date: 11/14/1991  . Smokeless tobacco: Never Used  . Alcohol Use: 1.0 oz/week    2 drink(s) per week     Comment: per week   family history includes Cancer in her paternal aunt; Diabetes in her father, maternal grandfather, and maternal grandmother; Heart attack in her paternal uncle; Hypertension in her father; Kidney disease in her father; Parkinsonism in her paternal aunt; Stroke in her paternal aunt; Thyroid disease in her mother and  paternal aunt.  ROS as above Medications: Current Outpatient Prescriptions  Medication Sig Dispense Refill  . Cyanocobalamin (B-12) 2500 MCG TABS Take by mouth once a week.     . folic acid (FOLVITE) 0.5 MG tablet Take 1 mg by mouth daily.    . pantoprazole (PROTONIX) 40 MG tablet Take 1 tablet by mouth daily - OFFICE APPOINTMENT NEEDED FOR REFILLS 90 tablet 0  . diclofenac sodium (VOLTAREN) 1 % GEL Apply 2 g topically 4 (four) times daily. To affected joint. 100 g 11   No current facility-administered medications for this visit.   Allergies  Allergen Reactions  . Adhesive [Tape] Itching  . Biaxin [Clarithromycin]      Exam:  BP 138/84 mmHg  Pulse 71  Wt 145 lb (65.772 kg) Gen: Well NAD HEENT: EOMI,  MMM Lungs: Normal work of breathing. CTABL Heart: RRR no MRG Abd: NABS, Soft. Nondistended, Nontender Exts: Brisk capillary refill, warm and well perfused.  Right shoulder normal-appearing nontender. Full abduction motion but painful arc. Normal external and internal rotation. Positive Hawkins and Neer's test. Negative Yergason's and speeds test. Positive empty can test. Strength is intact bilateral shoulders abduction external and internal rotation. Pulses capillary refill sensation are intact distally. Right hand normal-appearing minimally tender right fifth MCP and PIP. Full motion over pain with flexion of the MCP and PIP and resisted extension of the MCP and PIP.  No results found for this or any previous visit (from the past 24 hour(s)). No results found.   Please see individual assessment and plan sections.

## 2015-07-20 NOTE — Patient Instructions (Signed)
Thank you for coming in today. Return in a few weeks. Attend physical therapy Take up to 2 Aleve twice daily for pain and use diclofenac gel on the finger Buddy tape the fingers together. X-rays today  Impingement Syndrome, Rotator Cuff, Bursitis with Rehab Impingement syndrome is a condition that involves inflammation of the tendons of the rotator cuff and the subacromial bursa, that causes pain in the shoulder. The rotator cuff consists of four tendons and muscles that control much of the shoulder and upper arm function. The subacromial bursa is a fluid filled sac that helps reduce friction between the rotator cuff and one of the bones of the shoulder (acromion). Impingement syndrome is usually an overuse injury that causes swelling of the bursa (bursitis), swelling of the tendon (tendonitis), and/or a tear of the tendon (strain). Strains are classified into three categories. Grade 1 strains cause pain, but the tendon is not lengthened. Grade 2 strains include a lengthened ligament, due to the ligament being stretched or partially ruptured. With grade 2 strains there is still function, although the function may be decreased. Grade 3 strains include a complete tear of the tendon or muscle, and function is usually impaired. SYMPTOMS   Pain around the shoulder, often at the outer portion of the upper arm.  Pain that gets worse with shoulder function, especially when reaching overhead or lifting.  Sometimes, aching when not using the arm.  Pain that wakes you up at night.  Sometimes, tenderness, swelling, warmth, or redness over the affected area.  Loss of strength.  Limited motion of the shoulder, especially reaching behind the back (to the back pocket or to unhook bra) or across your body.  Crackling sound (crepitation) when moving the arm.  Biceps tendon pain and inflammation (in the front of the shoulder). Worse when bending the elbow or lifting. CAUSES  Impingement syndrome is often  an overuse injury, in which chronic (repetitive) motions cause the tendons or bursa to become inflamed. A strain occurs when a force is paced on the tendon or muscle that is greater than it can withstand. Common mechanisms of injury include: Stress from sudden increase in duration, frequency, or intensity of training.  Direct hit (trauma) to the shoulder.  Aging, erosion of the tendon with normal use.  Bony bump on shoulder (acromial spur). RISK INCREASES WITH:  Contact sports (football, wrestling, boxing).  Throwing sports (baseball, tennis, volleyball).  Weightlifting and bodybuilding.  Heavy labor.  Previous injury to the rotator cuff, including impingement.  Poor shoulder strength and flexibility.  Failure to warm up properly before activity.  Inadequate protective equipment.  Old age.  Bony bump on shoulder (acromial spur). PREVENTION   Warm up and stretch properly before activity.  Allow for adequate recovery between workouts.  Maintain physical fitness:  Strength, flexibility, and endurance.  Cardiovascular fitness.  Learn and use proper exercise technique. PROGNOSIS  If treated properly, impingement syndrome usually goes away within 6 weeks. Sometimes surgery is required.  RELATED COMPLICATIONS   Longer healing time if not properly treated, or if not given enough time to heal.  Recurring symptoms, that result in a chronic condition.  Shoulder stiffness, frozen shoulder, or loss of motion.  Rotator cuff tendon tear.  Recurring symptoms, especially if activity is resumed too soon, with overuse, with a direct blow, or when using poor technique. TREATMENT  Treatment first involves the use of ice and medicine, to reduce pain and inflammation. The use of strengthening and stretching exercises may help reduce pain  with activity. These exercises may be performed at home or with a therapist. If non-surgical treatment is unsuccessful after more than 6 months,  surgery may be advised. After surgery and rehabilitation, activity is usually possible in 3 months.  MEDICATION  If pain medicine is needed, nonsteroidal anti-inflammatory medicines (aspirin and ibuprofen), or other minor pain relievers (acetaminophen), are often advised.  Do not take pain medicine for 7 days before surgery.  Prescription pain relievers may be given, if your caregiver thinks they are needed. Use only as directed and only as much as you need.  Corticosteroid injections may be given by your caregiver. These injections should be reserved for the most serious cases, because they may only be given a certain number of times. HEAT AND COLD  Cold treatment (icing) should be applied for 10 to 15 minutes every 2 to 3 hours for inflammation and pain, and immediately after activity that aggravates your symptoms. Use ice packs or an ice massage.  Heat treatment may be used before performing stretching and strengthening activities prescribed by your caregiver, physical therapist, or athletic trainer. Use a heat pack or a warm water soak. SEEK MEDICAL CARE IF:   Symptoms get worse or do not improve in 4 to 6 weeks, despite treatment.  New, unexplained symptoms develop. (Drugs used in treatment may produce side effects.) EXERCISES  RANGE OF MOTION (ROM) AND STRETCHING EXERCISES - Impingement Syndrome (Rotator Cuff  Tendinitis, Bursitis) These exercises may help you when beginning to rehabilitate your injury. Your symptoms may go away with or without further involvement from your physician, physical therapist or athletic trainer. While completing these exercises, remember:   Restoring tissue flexibility helps normal motion to return to the joints. This allows healthier, less painful movement and activity.  An effective stretch should be held for at least 30 seconds.  A stretch should never be painful. You should only feel a gentle lengthening or release in the stretched tissue. STRETCH  - Flexion, Standing  Stand with good posture. With an underhand grip on your right / left hand, and an overhand grip on the opposite hand, grasp a broomstick or cane so that your hands are a little more than shoulder width apart.  Keeping your right / left elbow straight and shoulder muscles relaxed, push the stick with your opposite hand, to raise your right / left arm in front of your body and then overhead. Raise your arm until you feel a stretch in your right / left shoulder, but before you have increased shoulder pain.  Try to avoid shrugging your right / left shoulder as your arm rises, by keeping your shoulder blade tucked down and toward your mid-back spine. Hold for __________ seconds.  Slowly return to the starting position. Repeat __________ times. Complete this exercise __________ times per day. STRETCH - Abduction, Supine  Lie on your back. With an underhand grip on your right / left hand and an overhand grip on the opposite hand, grasp a broomstick or cane so that your hands are a little more than shoulder width apart.  Keeping your right / left elbow straight and your shoulder muscles relaxed, push the stick with your opposite hand, to raise your right / left arm out to the side of your body and then overhead. Raise your arm until you feel a stretch in your right / left shoulder, but before you have increased shoulder pain.  Try to avoid shrugging your right / left shoulder as your arm rises, by keeping your  shoulder blade tucked down and toward your mid-back spine. Hold for __________ seconds.  Slowly return to the starting position. Repeat __________ times. Complete this exercise __________ times per day. ROM - Flexion, Active-Assisted  Lie on your back. You may bend your knees for comfort.  Grasp a broomstick or cane so your hands are about shoulder width apart. Your right / left hand should grip the end of the stick, so that your hand is positioned "thumbs-up," as if you  were about to shake hands.  Using your healthy arm to lead, raise your right / left arm overhead, until you feel a gentle stretch in your shoulder. Hold for __________ seconds.  Use the stick to assist in returning your right / left arm to its starting position. Repeat __________ times. Complete this exercise __________ times per day.  ROM - Internal Rotation, Supine   Lie on your back on a firm surface. Place your right / left elbow about 60 degrees away from your side. Elevate your elbow with a folded towel, so that the elbow and shoulder are the same height.  Using a broomstick or cane and your strong arm, pull your right / left hand toward your body until you feel a gentle stretch, but no increase in your shoulder pain. Keep your shoulder and elbow in place throughout the exercise.  Hold for __________ seconds. Slowly return to the starting position. Repeat __________ times. Complete this exercise __________ times per day. STRETCH - Internal Rotation  Place your right / left hand behind your back, palm up.  Throw a towel or belt over your opposite shoulder. Grasp the towel with your right / left hand.  While keeping an upright posture, gently pull up on the towel, until you feel a stretch in the front of your right / left shoulder.  Avoid shrugging your right / left shoulder as your arm rises, by keeping your shoulder blade tucked down and toward your mid-back spine.  Hold for __________ seconds. Release the stretch, by lowering your healthy hand. Repeat __________ times. Complete this exercise __________ times per day. ROM - Internal Rotation   Using an underhand grip, grasp a stick behind your back with both hands.  While standing upright with good posture, slide the stick up your back until you feel a mild stretch in the front of your shoulder.  Hold for __________ seconds. Slowly return to your starting position. Repeat __________ times. Complete this exercise __________  times per day.  STRETCH - Posterior Shoulder Capsule   Stand or sit with good posture. Grasp your right / left elbow and draw it across your chest, keeping it at the same height as your shoulder.  Pull your elbow, so your upper arm comes in closer to your chest. Pull until you feel a gentle stretch in the back of your shoulder.  Hold for __________ seconds. Repeat __________ times. Complete this exercise __________ times per day. STRENGTHENING EXERCISES - Impingement Syndrome (Rotator Cuff Tendinitis, Bursitis) These exercises may help you when beginning to rehabilitate your injury. They may resolve your symptoms with or without further involvement from your physician, physical therapist or athletic trainer. While completing these exercises, remember:  Muscles can gain both the endurance and the strength needed for everyday activities through controlled exercises.  Complete these exercises as instructed by your physician, physical therapist or athletic trainer. Increase the resistance and repetitions only as guided.  You may experience muscle soreness or fatigue, but the pain or discomfort you are trying  to eliminate should never worsen during these exercises. If this pain does get worse, stop and make sure you are following the directions exactly. If the pain is still present after adjustments, discontinue the exercise until you can discuss the trouble with your clinician.  During your recovery, avoid activity or exercises which involve actions that place your injured hand or elbow above your head or behind your back or head. These positions stress the tissues which you are trying to heal. STRENGTH - Scapular Depression and Adduction   With good posture, sit on a firm chair. Support your arms in front of you, with pillows, arm rests, or on a table top. Have your elbows in line with the sides of your body.  Gently draw your shoulder blades down and toward your mid-back spine. Gradually  increase the tension, without tensing the muscles along the top of your shoulders and the back of your neck.  Hold for __________ seconds. Slowly release the tension and relax your muscles completely before starting the next repetition.  After you have practiced this exercise, remove the arm support and complete the exercise in standing as well as sitting position. Repeat __________ times. Complete this exercise __________ times per day.  STRENGTH - Shoulder Abductors, Isometric  With good posture, stand or sit about 4-6 inches from a wall, with your right / left side facing the wall.  Bend your right / left elbow. Gently press your right / left elbow into the wall. Increase the pressure gradually, until you are pressing as hard as you can, without shrugging your shoulder or increasing any shoulder discomfort.  Hold for __________ seconds.  Release the tension slowly. Relax your shoulder muscles completely before you begin the next repetition. Repeat __________ times. Complete this exercise __________ times per day.  STRENGTH - External Rotators, Isometric  Keep your right / left elbow at your side and bend it 90 degrees.  Step into a door frame so that the outside of your right / left wrist can press against the door frame without your upper arm leaving your side.  Gently press your right / left wrist into the door frame, as if you were trying to swing the back of your hand away from your stomach. Gradually increase the tension, until you are pressing as hard as you can, without shrugging your shoulder or increasing any shoulder discomfort.  Hold for __________ seconds.  Release the tension slowly. Relax your shoulder muscles completely before you begin the next repetition. Repeat __________ times. Complete this exercise __________ times per day.  STRENGTH - Supraspinatus   Stand or sit with good posture. Grasp a __________ weight, or an exercise band or tubing, so that your hand is  "thumbs-up," like you are shaking hands.  Slowly lift your right / left arm in a "V" away from your thigh, diagonally into the space between your side and straight ahead. Lift your hand to shoulder height or as far as you can, without increasing any shoulder pain. At first, many people do not lift their hands above shoulder height.  Avoid shrugging your right / left shoulder as your arm rises, by keeping your shoulder blade tucked down and toward your mid-back spine.  Hold for __________ seconds. Control the descent of your hand, as you slowly return to your starting position. Repeat __________ times. Complete this exercise __________ times per day.  STRENGTH - External Rotators  Secure a rubber exercise band or tubing to a fixed object (table, pole) so that  it is at the same height as your right / left elbow when you are standing or sitting on a firm surface.  Stand or sit so that the secured exercise band is at your uninjured side.  Bend your right / left elbow 90 degrees. Place a folded towel or small pillow under your right / left arm, so that your elbow is a few inches away from your side.  Keeping the tension on the exercise band, pull it away from your body, as if pivoting on your elbow. Be sure to keep your body steady, so that the movement is coming only from your rotating shoulder.  Hold for __________ seconds. Release the tension in a controlled manner, as you return to the starting position. Repeat __________ times. Complete this exercise __________ times per day.  STRENGTH - Internal Rotators   Secure a rubber exercise band or tubing to a fixed object (table, pole) so that it is at the same height as your right / left elbow when you are standing or sitting on a firm surface.  Stand or sit so that the secured exercise band is at your right / left side.  Bend your elbow 90 degrees. Place a folded towel or small pillow under your right / left arm so that your elbow is a few inches  away from your side.  Keeping the tension on the exercise band, pull it across your body, toward your stomach. Be sure to keep your body steady, so that the movement is coming only from your rotating shoulder.  Hold for __________ seconds. Release the tension in a controlled manner, as you return to the starting position. Repeat __________ times. Complete this exercise __________ times per day.  STRENGTH - Scapular Protractors, Standing   Stand arms length away from a wall. Place your hands on the wall, keeping your elbows straight.  Begin by dropping your shoulder blades down and toward your mid-back spine.  To strengthen your protractors, keep your shoulder blades down, but slide them forward on your rib cage. It will feel as if you are lifting the back of your rib cage away from the wall. This is a subtle motion and can be challenging to complete. Ask your caregiver for further instruction, if you are not sure you are doing the exercise correctly.  Hold for __________ seconds. Slowly return to the starting position, resting the muscles completely before starting the next repetition. Repeat __________ times. Complete this exercise __________ times per day. STRENGTH - Scapular Protractors, Supine  Lie on your back on a firm surface. Extend your right / left arm straight into the air while holding a __________ weight in your hand.  Keeping your head and back in place, lift your shoulder off the floor.  Hold for __________ seconds. Slowly return to the starting position, and allow your muscles to relax completely before starting the next repetition. Repeat __________ times. Complete this exercise __________ times per day. STRENGTH - Scapular Protractors, Quadruped  Get onto your hands and knees, with your shoulders directly over your hands (or as close as you can be, comfortably).  Keeping your elbows locked, lift the back of your rib cage up into your shoulder blades, so your mid-back  rounds out. Keep your neck muscles relaxed.  Hold this position for __________ seconds. Slowly return to the starting position and allow your muscles to relax completely before starting the next repetition. Repeat __________ times. Complete this exercise __________ times per day.  STRENGTH - Scapular Retractors  Secure a rubber exercise band or tubing to a fixed object (table, pole), so that it is at the height of your shoulders when you are either standing, or sitting on a firm armless chair.  With a palm down grip, grasp an end of the band in each hand. Straighten your elbows and lift your hands straight in front of you, at shoulder height. Step back, away from the secured end of the band, until it becomes tense.  Squeezing your shoulder blades together, draw your elbows back toward your sides, as you bend them. Keep your upper arms lifted away from your body throughout the exercise.  Hold for __________ seconds. Slowly ease the tension on the band, as you reverse the directions and return to the starting position. Repeat __________ times. Complete this exercise __________ times per day. STRENGTH - Shoulder Extensors   Secure a rubber exercise band or tubing to a fixed object (table, pole) so that it is at the height of your shoulders when you are either standing, or sitting on a firm armless chair.  With a thumbs-up grip, grasp an end of the band in each hand. Straighten your elbows and lift your hands straight in front of you, at shoulder height. Step back, away from the secured end of the band, until it becomes tense.  Squeezing your shoulder blades together, pull your hands down to the sides of your thighs. Do not allow your hands to go behind you.  Hold for __________ seconds. Slowly ease the tension on the band, as you reverse the directions and return to the starting position. Repeat __________ times. Complete this exercise __________ times per day.  STRENGTH - Scapular Retractors  and External Rotators   Secure a rubber exercise band or tubing to a fixed object (table, pole) so that it is at the height as your shoulders, when you are either standing, or sitting on a firm armless chair.  With a palm down grip, grasp an end of the band in each hand. Bend your elbows 90 degrees and lift your elbows to shoulder height, at your sides. Step back, away from the secured end of the band, until it becomes tense.  Squeezing your shoulder blades together, rotate your shoulders so that your upper arms and elbows remain stationary, but your fists travel upward to head height.  Hold for __________ seconds. Slowly ease the tension on the band, as you reverse the directions and return to the starting position. Repeat __________ times. Complete this exercise __________ times per day.  STRENGTH - Scapular Retractors and External Rotators, Rowing   Secure a rubber exercise band or tubing to a fixed object (table, pole) so that it is at the height of your shoulders, when you are either standing, or sitting on a firm armless chair.  With a palm down grip, grasp an end of the band in each hand. Straighten your elbows and lift your hands straight in front of you, at shoulder height. Step back, away from the secured end of the band, until it becomes tense.  Step 1: Squeeze your shoulder blades together. Bending your elbows, draw your hands to your chest, as if you are rowing a boat. At the end of this motion, your hands and elbow should be at shoulder height and your elbows should be out to your sides.  Step 2: Rotate your shoulders, to raise your hands above your head. Your forearms should be vertical and your upper arms should be horizontal.  Hold for __________ seconds.  Slowly ease the tension on the band, as you reverse the directions and return to the starting position. Repeat __________ times. Complete this exercise __________ times per day.  STRENGTH - Scapular Depressors  Find a sturdy  chair without wheels, such as a dining room chair.  Keeping your feet on the floor, and your hands on the chair arms, lift your bottom up from the seat, and lock your elbows.  Keeping your elbows straight, allow gravity to pull your body weight down. Your shoulders will rise toward your ears.  Raise your body against gravity by drawing your shoulder blades down your back, shortening the distance between your shoulders and ears. Although your feet should always maintain contact with the floor, your feet should progressively support less body weight, as you get stronger.  Hold for __________ seconds. In a controlled and slow manner, lower your body weight to begin the next repetition. Repeat __________ times. Complete this exercise __________ times per day.  Document Released: 10/30/2005 Document Revised: 01/22/2012 Document Reviewed: 02/11/2009 [facility] Patient Information 2015 [place], [place]. This information is not intended to replace advice given to you by your health care provider. Make sure you discuss any questions you have with your health care provider.   Buddy Taping You have a minor finger or toe injury. It can be managed by buddy taping. Buddy taping means the injured finger or toe is taped to a healthy uninjured adjacent finger or toe. Most minor fractures and dislocations of the smaller fingers and toes will heal in 3 to 4 weeks. Buddy taping immobilizes and protects the area of injury. Buddy taping is not recommended for initial treatment of fractures of the thumb, longer fingers, or the great toe. Buddy taping should not be used for unstable or deformed fractures, but as fracture healing progresses it may be used for protection during rehabilitation. Fractured fingers and toes should be protected by buddy taping as long as the injury is still painful or swollen.  When an injury is buddy taped, place a small piece of gauze or cotton between the digits that are taped. This helps prevent  the skin from breaking down from increased moisture. Buddy taping allows you to get your injury wet when you bathe. Change the gauze and tape more often if it gets wet, and dry the space between the finger or toes. Use a sturdy, hard-soled shoe for better support if you have a fractured toe. In 2 to 3 weeks you can start motion exercises. This will keep the fingers or toes from becoming stiff.  SEEK IMMEDIATE MEDICAL CARE IF:   The injured area becomes cold, numb, or pale.  You have pain not controlled with medications.  You notice increasing deformity of the toe or finger. Document Released: 12/07/2004 Document Revised: 01/22/2012 Document Reviewed: 04/07/2009 [facility] Patient Information 2015 [place], [place]. This information is not intended to replace advice given to you by your health care provider. Make sure you discuss any questions you have with your health care provider.

## 2015-07-20 NOTE — Progress Notes (Signed)
Quick Note:  Xray of shoulder looks normal. ______

## 2015-07-20 NOTE — Assessment & Plan Note (Signed)
Likely subacromial bursitis. Treat with Aleve referral for physical therapy. X-ray pending. Return if not better. At that time would recommend subacromial injection.

## 2015-08-07 ENCOUNTER — Emergency Department
Admission: EM | Admit: 2015-08-07 | Discharge: 2015-08-07 | Disposition: A | Discharge status: Home / Self Care | Payer: PRIVATE HEALTH INSURANCE | Source: Home / Self Care | Attending: Family Medicine | Admitting: Family Medicine

## 2015-08-07 ENCOUNTER — Encounter: Payer: Self-pay | Admitting: *Deleted

## 2015-08-07 DIAGNOSIS — T7840XA Allergy, unspecified, initial encounter: Secondary | ICD-10-CM | POA: Diagnosis not present

## 2015-08-07 DIAGNOSIS — K5900 Constipation, unspecified: Secondary | ICD-10-CM

## 2015-08-07 DIAGNOSIS — L500 Allergic urticaria: Secondary | ICD-10-CM | POA: Diagnosis not present

## 2015-08-07 HISTORY — DX: Nontoxic single thyroid nodule: E04.1

## 2015-08-07 MED ORDER — DIPHENHYDRAMINE HCL 50 MG/ML IJ SOLN
50.0000 mg | Freq: Once | INTRAMUSCULAR | Status: AC
  Administered 2015-08-07: 50 mg via INTRAMUSCULAR

## 2015-08-07 MED ORDER — METHYLPREDNISOLONE SODIUM SUCC 125 MG IJ SOLR
80.0000 mg | Freq: Once | INTRAMUSCULAR | Status: AC
  Administered 2015-08-07: 80 mg via INTRAMUSCULAR

## 2015-08-07 MED ORDER — DIPHENHYDRAMINE HCL 25 MG PO TABS: 25.0000 mg | ORAL_TABLET | Freq: Four times a day (QID) | ORAL | Status: DC

## 2015-08-07 MED ORDER — POLYETHYLENE GLYCOL 3350 17 G PO PACK: 17.0000 g | PACK | Freq: Every day | ORAL | Status: DC

## 2015-08-07 MED ORDER — PREDNISONE 20 MG PO TABS
ORAL_TABLET | ORAL | Status: DC
Start: 2015-08-07 — End: 2015-08-08

## 2015-08-07 MED ORDER — FAMOTIDINE 40 MG PO TABS
40.0000 mg | ORAL_TABLET | Freq: Once | ORAL | Status: AC
  Administered 2015-08-07: 40 mg via ORAL

## 2015-08-07 MED ORDER — EPINEPHRINE 0.3 MG/0.3ML IJ SOAJ: 0.3000 mg | Freq: Once | INTRAMUSCULAR | Status: DC

## 2015-08-07 NOTE — Discharge Instructions (Signed)
Please take your first dose of prednisone (60mg ) tonight at dinner, then take 40mg  daily for 4 days.  You may take 25mg  benadryl every 4-6 hours as needed for itching or rash.  You may take the miralax as prescribed to help prevent constipation.  Please speak with Dr. Madilyn Fireman about continued use of activated charcoal.  Also, please call poison control or 911 prior to taking activated charcoal next time you develop hives.  If you develop any throat pain, swelling of lips or tongue, difficulty breathing, or other new concerning symptoms develop, please call 911 or have someone drive you to closest emergency department immediately.    Please schedule a follow up appointment with Dr. Madilyn Fireman this week to discuss potential need for Epi-pen and referral to allergist.  See below for further instructions.

## 2015-08-07 NOTE — ED Notes (Signed)
Pt developed hives last night.  She denies adding anything new to her diet or on her skin or clothes.  She has hives all over her body and are itchy and red.  She has tried benadryl Claritin and Zyrtec as well as activated charcoal with no relief f rom any of these.  She Denies any SOB.  Last Benadryl 1am.

## 2015-08-07 NOTE — ED Provider Notes (Signed)
CSN: [id number]     Arrival date & time 08/07/15  5621 History   First MD Initiated Contact with Patient 08/07/15 [phone]     Chief Complaint  Patient presents with  . 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  Pt states she took Benadryl at that time and has also taken Claritin and Zyrtec without relief.  Around 4:30AM pt took OTC activated charcoal, pt believes about 500mg  that she got from a Vitamin store. Denies relief of rash and itching. Itching is moderate to severe. She denies throat swelling or difficulty breathing.  She has not vomited, denies nausea and has not had diarrhea.  Pt states she is usually constipated.  States she ate the same dinner she "always" eats last night. Denies new soaps, lotions, or medications.  Last time she had hives was about 3 months ago when she ate almonds.  Denies having an Epi-pen. No hx of anaphylaxis.    Past Medical History  Diagnosis Date  . Axillary lump     RT  . Anemia   . Arthritis   . Thyroid disease     multinodular goiter  . Gluten intolerance   . Thyroid nodule    Past Surgical History  Procedure Laterality Date  . Tonsillectomy    . Tubal ligation    . Uterine ablation    . Hemorrhoid surgery  12/2009   Family History  Problem Relation Age of Onset  . Hypertension Father   . Kidney disease Father     agenesis  . Diabetes Father   . Cancer Paternal Aunt     breast  . Diabetes Maternal Grandmother   . Diabetes Maternal Grandfather   . Heart attack Paternal Uncle   . Stroke Paternal Aunt   . Parkinsonism Paternal Aunt   . Thyroid disease Mother   . Thyroid disease Paternal Aunt    Social History  Substance Use Topics  . Smoking status: Former Smoker     Quit date: 11/14/1991  . Smokeless tobacco: Never Used  . Alcohol Use: 1.0 oz/week    2 Standard drinks or equivalent per week     Comment: per week   OB History    No data available     Review of Systems  Constitutional: Negative for fever and chills.  HENT: Negative for sore throat, trouble swallowing and voice change.   Respiratory: Negative for cough and shortness of breath.   Cardiovascular: Negative for chest pain and palpitations.  Gastrointestinal: Negative for nausea, vomiting, abdominal pain and diarrhea.  Musculoskeletal: Negative for myalgias, back pain and neck pain.  Skin: Positive for color change and rash. Negative for wound.       hives  Neurological: Negative for weakness and numbness.    Allergies  Adhesive; Biaxin; and Other  Home Medications   Prior to Admission medications   Medication Sig Start Date End Date Taking? Authorizing Provider  Cyanocobalamin (B-12) 2500 MCG TABS Take by mouth once a week.     Historical Provider, MD  diclofenac sodium (VOLTAREN) 1 % GEL Apply 2 g topically 4 (four) times daily. To affected joint. 07/20/15   Gregor Hams, MD  diphenhydrAMINE (BENADRYL) 25 MG tablet Take 1 tablet (25 mg total) by mouth every 6 (six) hours. 08/07/15  Noland Fordyce, PA-C  EPINEPHrine 0.3 mg/0.3 mL IJ SOAJ injection Inject 0.3 mLs (0.3 mg total) into the muscle once. 08/07/15   Noland Fordyce, PA-C  folic acid (FOLVITE) 0.5 MG tablet Take 1 mg by mouth daily.    Historical Provider, MD  pantoprazole (PROTONIX) 40 MG tablet Take 1 tablet by mouth daily - OFFICE APPOINTMENT NEEDED FOR REFILLS 11/25/14   Hali Marry, MD  polyethylene glycol [facility] / Floria Raveling) packet Take 17 g by mouth daily. 08/07/15   Noland Fordyce, PA-C  predniSONE (DELTASONE) 20 MG tablet 3 tabs po day one, then 2 po daily x 4 days 08/07/15   Noland Fordyce, PA-C   Meds Ordered and Administered this Visit   Medications  methylPREDNISolone sodium succinate (SOLU-MEDROL) 125  mg/2 mL injection 80 mg (80 mg Intramuscular Given 08/07/15 1003)  famotidine (PEPCID) tablet 40 mg (40 mg Oral Given 08/07/15 1004)  diphenhydrAMINE (BENADRYL) injection 50 mg (50 mg Intramuscular Given 08/07/15 1004)    BP 112/73 mmHg  Pulse 99  Temp(Src) 98.5 F (36.9 C) (Oral)  Ht 5\' 4"  (1.626 m)  Wt 142 lb 4 oz (64.524 kg)  BMI 24.41 kg/m2  SpO2 100% No data found.   Physical Exam  Constitutional: She appears well-developed and well-nourished.  Pt sitting in exam chair, scratching at rash, appears uncomfortable.   HENT:  Head: Normocephalic and atraumatic.  Nose: Nose normal.  Mouth/Throat: Uvula is midline, oropharynx is clear and moist and mucous membranes are normal.  No facial swelling.  No perioral, tongue or oral-pharyngeal swelling  Eyes: Conjunctivae are normal. No scleral icterus.  Neck: Normal range of motion. Neck supple.  Cardiovascular: Normal rate, regular rhythm and normal heart sounds.   Pulmonary/Chest: Effort normal and breath sounds normal. No stridor. No respiratory distress. She has no wheezes. She has no rales. She exhibits no tenderness.  No respiratory distress, able to speak in full sentences w/o difficulty.  No wheeze.   Abdominal: Soft. She exhibits no distension and no mass. There is no tenderness. There is no rebound and no guarding.  Musculoskeletal: Normal range of motion.  Neurological: She is alert.  Skin: Skin is warm and dry. Rash noted. She is not diaphoretic. There is erythema.  Diffuse urticaria on face, neck, upper back and upper chest.  Diffuse smaller areas of urticaria on arms.   Nursing note and vitals reviewed.   ED Course  Procedures (including critical care time)  Labs Review Labs Reviewed - No data to display  Imaging Review No results found.    MDM   1. Allergic reaction, initial encounter   2. Allergic urticaria   3. Constipation, unspecified constipation type     Pt presenting to [facility] with hives over her face,  neck, upper chest, and back.  Unknown cause, but pt had allergies to Almonds and Hazelnuts.  Pt tachycardic in triage, HR 132. Pt mildly anxious upon arrival, scratching at hives. Pt took about 500mg  activated charcoal she got from Vitamin store around 4:30AM. No hx of anaphylaxis. Pt denies chest pain, shortness of breath or throat swelling. No oral swelling seen on exam. Pt given Solumedrol 80mg  IM, benadryl 50mg  IM, and pepcid 40mg  PO.   Pt observed in UC. Symptoms started to improved significantly within 10 minutes of medications given.  Pt observed in [facility] for 90 minutes after medications given. Vitals improved significantly.  Urticaria still present but much improved.  Vitals: HR- 97, BP- 112/73 at discharge.  Pt states she feels comfortable  being discharged home.  Strongly encouraged pt to f/u with Dr. Madilyn Fireman next week for recheck of symptoms and for possible referral to allergist.  Encouraged pt to discuss the use of activated charcoal supplement. Advised pt to call poison control or call 911 prior to taking next time she gets hives or concern for allergic reaction. Information, Pt instruction packet on use of EpiPen provided. Discussed when to use and to call 911 if used. Rx: EpiPen. Rx: prednisone, benadryl, and Miralax (pt anxious to take benadryl as she is concerned for constipation)  Discussed symptoms that warrant emergent care in the ED. Patient verbalized understanding and agreement with treatment plan.       Noland Fordyce, PA-C 08/07/15 1138

## 2015-08-08 ENCOUNTER — Encounter (HOSPITAL_BASED_OUTPATIENT_CLINIC_OR_DEPARTMENT_OTHER): Payer: Self-pay

## 2015-08-08 ENCOUNTER — Emergency Department (HOSPITAL_BASED_OUTPATIENT_CLINIC_OR_DEPARTMENT_OTHER)
Admission: EM | Admit: 2015-08-08 | Discharge: 2015-08-08 | Disposition: A | Discharge status: Home / Self Care | Payer: PRIVATE HEALTH INSURANCE | Attending: Emergency Medicine | Admitting: Emergency Medicine

## 2015-08-08 DIAGNOSIS — Z8719 Personal history of other diseases of the digestive system: Secondary | ICD-10-CM | POA: Diagnosis not present

## 2015-08-08 DIAGNOSIS — D649 Anemia, unspecified: Secondary | ICD-10-CM | POA: Insufficient documentation

## 2015-08-08 DIAGNOSIS — L509 Urticaria, unspecified: Secondary | ICD-10-CM | POA: Diagnosis not present

## 2015-08-08 DIAGNOSIS — R21 Rash and other nonspecific skin eruption: Secondary | ICD-10-CM | POA: Diagnosis present

## 2015-08-08 DIAGNOSIS — Z79899 Other long term (current) drug therapy: Secondary | ICD-10-CM | POA: Insufficient documentation

## 2015-08-08 DIAGNOSIS — Z87891 Personal history of nicotine dependence: Secondary | ICD-10-CM | POA: Diagnosis not present

## 2015-08-08 DIAGNOSIS — Z8639 Personal history of other endocrine, nutritional and metabolic disease: Secondary | ICD-10-CM | POA: Insufficient documentation

## 2015-08-08 MED ORDER — HYDROXYZINE HCL 25 MG PO TABS
25.0000 mg | ORAL_TABLET | Freq: Four times a day (QID) | ORAL | Status: DC
Start: 2015-08-08 — End: 2015-08-24

## 2015-08-08 MED ORDER — HYDROXYZINE HCL 25 MG PO TABS
25.0000 mg | ORAL_TABLET | Freq: Once | ORAL | Status: AC
  Administered 2015-08-08: 25 mg via ORAL
  Filled 2015-08-08: qty 1

## 2015-08-08 MED ORDER — PREDNISONE 50 MG PO TABS
60.0000 mg | ORAL_TABLET | Freq: Once | ORAL | Status: AC
  Administered 2015-08-08: 60 mg via ORAL
  Filled 2015-08-08 (×2): qty 1

## 2015-08-08 MED ORDER — FAMOTIDINE 20 MG PO TABS
20.0000 mg | ORAL_TABLET | Freq: Once | ORAL | Status: AC
  Administered 2015-08-08: 20 mg via ORAL
  Filled 2015-08-08: qty 1

## 2015-08-08 MED ORDER — PREDNISONE 20 MG PO TABS: ORAL_TABLET | ORAL | Status: DC

## 2015-08-08 NOTE — ED Notes (Signed)
Patient here with rash/hives from head to toe. Seen at urgent care on Friday for same and had solumedrol and benadryl with resolution. This am awoke with rash, itching again. Has taken benadryl pta

## 2015-08-08 NOTE — ED Provider Notes (Signed)
CSN: [id number]     Arrival date & time 08/08/15  1234 History   First MD Initiated Contact with Patient 08/08/15 1253     Chief Complaint  Patient presents with  . Urticaria     (Consider location/radiation/quality/duration/timing/severity/associated sxs/prior Treatment) HPI Comments: Patient presents with complaint of widespread itching and hives starting early yesterday morning. Patient has a history of allergies to tree nuts, Biaxin, adhesive tape. She has had mild localized swelling and itching in the past, never widespread. Patient treated herself at home with Benadryl, Claritin, Zyrtec without improvement. She also took activated charcoal which did not help. Yesterday she went to [place] urgent care and was treated with IM Solu-Medrol, IM Benadryl, oral Pepcid. She noted improvement in symptoms resolved last night. Upon waking this morning patient had return of severe hives. She took Benadryl which did not seem to help. She presents to the ED today for evaluation and treatment. Patient states that she plans to see her doctor this week for possible allergist referral. Patient otherwise denies lightheadedness or syncope. She denies difficulty breathing or shortness of breath. She denies vomiting or diarrhea. No new medications or skin contacts.  Patient is a 49 y.o. female presenting with urticaria. The history is provided by the patient.  Urticaria Associated symptoms include a rash. Pertinent negatives include no chest pain, fever, myalgias, nausea or vomiting.    Past Medical History  Diagnosis Date  . Axillary lump     RT  . Anemia   . Arthritis   . Thyroid disease     multinodular goiter  . Gluten intolerance   . Thyroid nodule    Past Surgical History  Procedure Laterality Date  . Tonsillectomy    . Tubal ligation    . Uterine ablation    . Hemorrhoid surgery  12/2009   Family History  Problem Relation Age of Onset  . Hypertension Father   . Kidney disease Father      agenesis  . Diabetes Father   . Cancer Paternal Aunt     breast  . Diabetes Maternal Grandmother   . Diabetes Maternal Grandfather   . Heart attack Paternal Uncle   . Stroke Paternal Aunt   . Parkinsonism Paternal Aunt   . Thyroid disease Mother   . Thyroid disease Paternal Aunt    Social History  Substance Use Topics  . Smoking status: Former Smoker    Quit date: 11/14/1991  . Smokeless tobacco: Never Used  . Alcohol Use: 1.0 oz/week    2 Standard drinks or equivalent per week     Comment: per week   OB History    No data available     Review of Systems  Constitutional: Negative for fever.  HENT: Negative for facial swelling and trouble swallowing.   Eyes: Negative for redness.  Respiratory: Negative for shortness of breath, wheezing and stridor.   Cardiovascular: Negative for chest pain.  Gastrointestinal: Negative for nausea and vomiting.  Musculoskeletal: Negative for myalgias.  Skin: Positive for rash.  Neurological: Negative for light-headedness.  Psychiatric/Behavioral: Negative for confusion.      Allergies  Adhesive; Biaxin; and Other  Home Medications   Prior to Admission medications   Medication Sig Start Date End Date Taking? Authorizing Provider  Cyanocobalamin (B-12) 2500 MCG TABS Take by mouth once a week.     Historical Provider, MD  diclofenac sodium (VOLTAREN) 1 % GEL Apply 2 g topically 4 (four) times daily. To affected joint. 07/20/15   Rebekah Chesterfield  Georgina Snell, MD  diphenhydrAMINE (BENADRYL) 25 MG tablet Take 1 tablet (25 mg total) by mouth every 6 (six) hours. 08/07/15   Noland Fordyce, PA-C  EPINEPHrine 0.3 mg/0.3 mL IJ SOAJ injection Inject 0.3 mLs (0.3 mg total) into the muscle once. 08/07/15   Noland Fordyce, PA-C  folic acid (FOLVITE) 0.5 MG tablet Take 1 mg by mouth daily.    Historical Provider, MD  pantoprazole (PROTONIX) 40 MG tablet Take 1 tablet by mouth daily - OFFICE APPOINTMENT NEEDED FOR REFILLS 11/25/14   Hali Marry, MD   polyethylene glycol [facility] / Floria Raveling) packet Take 17 g by mouth daily. 08/07/15   Noland Fordyce, PA-C  predniSONE (DELTASONE) 20 MG tablet 3 tabs po day one, then 2 po daily x 4 days 08/07/15   Noland Fordyce, PA-C   BP 132/81 mmHg  Pulse 117  Temp(Src) 98.3 F (36.8 C) (Oral)  Resp 18  Ht 5\' 4"  (1.626 m)  Wt 142 lb (64.411 kg)  BMI 24.36 kg/m2  SpO2 100% Physical Exam  Constitutional: She appears well-developed and well-nourished.  HENT:  Head: Normocephalic and atraumatic.  Mouth/Throat: Oropharynx is clear and moist.  No angioedema. No tongue swelling. Oropharynx clear. No mucosal involvement.  Eyes: Conjunctivae are normal. Right eye exhibits no discharge. Left eye exhibits no discharge.  Neck: Normal range of motion. Neck supple.  Cardiovascular: Normal rate, regular rhythm and normal heart sounds.   Pulmonary/Chest: Effort normal and breath sounds normal. No stridor. No respiratory distress. She has no wheezes. She has no rales.  Abdominal: Soft. There is no tenderness.  Neurological: She is alert.  Skin: Skin is warm and dry. Rash noted.  Patient with severe urticaria including face, chest, back, abdomen, arms and legs. She is scratching her skin but is otherwise in no distress.  Psychiatric: She has a normal mood and affect.  Nursing note and vitals reviewed.   ED Course  Procedures (including critical care time) Labs Review Labs Reviewed - No data to display  Imaging Review No results found. I have personally reviewed and evaluated these images and lab results as part of my medical decision-making.   EKG Interpretation None       1:23 PM Patient seen and examined. Work-up initiated. Medications ordered. Previous records reviewed.  Vital signs reviewed and are as follows: BP 132/81 mmHg  Pulse 117  Temp(Src) 98.3 F (36.8 C) (Oral)  Resp 18  Ht 5\' 4"  (1.626 m)  Wt 142 lb (64.411 kg)  BMI 24.36 kg/m2  SpO2 100%  Suspect rebound urticaria. Oral  prednisone, oral hydroxyzine, oral Pepcid ordered. No signs of anaphylaxis.  2:26 PM Patient stable, ready for discharge to home. Will treat with prednisone taper, Vistaril, Pepcid. Patient to follow-up with her PCP for recheck this week.  MDM   Final diagnoses:  Urticaria   Patient with widespread urticaria. No signs or reports of anaphylaxis. Patient stable during ED stay. Feel that she likely has an element of rebound urticaria as only Solu-Medrol was given yesterday. No airway involvement. Patient well-appearing, in no respiratory distress.    Carlisle Cater, PA-C 08/08/15 [place], DO 08/08/15 1454

## 2015-08-08 NOTE — Discharge Instructions (Signed)
Please read and follow all provided instructions.  Your diagnoses today include:  1. Urticaria     Tests performed today include:  Vital signs. See below for your results today.   Medications prescribed:   Prednisone - steroid medicine   It is best to take this medication in the morning to prevent sleeping problems. If you are diabetic, monitor your blood sugar closely and stop taking Prednisone if blood sugar is over 300. Take with food to prevent stomach upset.    Hydroxyzine - antihistamine  You can find this medication over-the-counter.   This medication will make you drowsy. DO NOT drive or perform any activities that require you to be awake and alert if taking this.   Pepcid (famotidine) - antihistamine  You can find this medication over-the-counter.   DO NOT exceed:   20mg  Pepcid every 12 hours   Take any prescribed medications only as directed.  Home care instructions:   Follow any educational materials contained in this packet  Follow-up instructions: Please follow-up with your primary care provider in the next 3 days for further evaluation of your symptoms.   Return instructions:   Please return to the Emergency Department if you experience worsening symptoms.   Call 9-1-1 immediately if you have an allergic reaction that involves your lips, mouth, throat or if you have any difficulty breathing. This is a life-threatening emergency.   Please return if you have any other emergent concerns.  Additional Information:  Your vital signs today were: BP 132/81 mmHg   Pulse 117   Temp(Src) 98.3 F (36.8 C) (Oral)   Resp 18   Ht 5\' 4"  (1.626 m)   Wt 142 lb (64.411 kg)   BMI 24.36 kg/m2   SpO2 100% If your blood pressure (BP) was elevated above 135/85 this visit, please have this repeated by your doctor within one month. --------------

## 2015-08-10 ENCOUNTER — Ambulatory Visit: Payer: PRIVATE HEALTH INSURANCE | Admitting: Family Medicine

## 2015-08-12 ENCOUNTER — Telehealth: Payer: Self-pay | Admitting: Family Medicine

## 2015-08-12 DIAGNOSIS — L500 Allergic urticaria: Secondary | ICD-10-CM

## 2015-08-12 DIAGNOSIS — L509 Urticaria, unspecified: Secondary | ICD-10-CM

## 2015-08-12 NOTE — Telephone Encounter (Signed)
Pt called and stated she would like to see an allergist bc she was seen in urgent care and they suggest her got to one .Marland Kitchen Thanks she has an appt to see you for her physical on Oct. 11th

## 2015-08-13 NOTE — Telephone Encounter (Signed)
Patient advised.

## 2015-08-13 NOTE — Telephone Encounter (Signed)
Referral placed.

## 2015-08-13 NOTE — Telephone Encounter (Signed)
Okay to place referral. Use diagnosis of hives and urticaria.

## 2015-08-24 ENCOUNTER — Other Ambulatory Visit (HOSPITAL_COMMUNITY)
Admission: RE | Admit: 2015-08-24 | Discharge: 2015-08-24 | Disposition: A | Discharge status: Home / Self Care | Payer: PRIVATE HEALTH INSURANCE | Source: Ambulatory Visit | Attending: Family Medicine | Admitting: Family Medicine

## 2015-08-24 ENCOUNTER — Ambulatory Visit (INDEPENDENT_AMBULATORY_CARE_PROVIDER_SITE_OTHER): Payer: PRIVATE HEALTH INSURANCE | Admitting: Family Medicine

## 2015-08-24 ENCOUNTER — Encounter: Payer: Self-pay | Admitting: Family Medicine

## 2015-08-24 VITALS — BP 136/79 | HR 87 | Ht 64.0 in | Wt 141.0 lb

## 2015-08-24 DIAGNOSIS — E041 Nontoxic single thyroid nodule: Secondary | ICD-10-CM

## 2015-08-24 DIAGNOSIS — Z01419 Encounter for gynecological examination (general) (routine) without abnormal findings: Secondary | ICD-10-CM | POA: Diagnosis present

## 2015-08-24 DIAGNOSIS — Z0000 Encounter for general adult medical examination without abnormal findings: Secondary | ICD-10-CM | POA: Diagnosis not present

## 2015-08-24 DIAGNOSIS — Z1151 Encounter for screening for human papillomavirus (HPV): Secondary | ICD-10-CM | POA: Diagnosis not present

## 2015-08-24 DIAGNOSIS — Z114 Encounter for screening for human immunodeficiency virus [HIV]: Secondary | ICD-10-CM | POA: Diagnosis not present

## 2015-08-24 DIAGNOSIS — Z1231 Encounter for screening mammogram for malignant neoplasm of breast: Secondary | ICD-10-CM

## 2015-08-24 DIAGNOSIS — Z1159 Encounter for screening for other viral diseases: Secondary | ICD-10-CM | POA: Diagnosis not present

## 2015-08-24 MED ORDER — METRONIDAZOLE 0.75 % EX GEL: 1.0000 "application " | Freq: Two times a day (BID) | CUTANEOUS | Status: DC

## 2015-08-24 MED ORDER — PANTOPRAZOLE SODIUM 40 MG PO TBEC: DELAYED_RELEASE_TABLET | ORAL | Status: DC

## 2015-08-24 NOTE — Progress Notes (Signed)
Subjective:     Tracie Schroeder is a 49 y.o. female and is here for a comprehensive physical exam. The patient reports problems - says her nose has been red for a few years..not painful or itchey. More bothersome cosmetically.    Social History   Social History  . Marital Status: Married    Spouse Name: N/A  . Number of Children: 2   . 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    Social History Main Topics  . Smoking status: Former Smoker    Quit date: 11/14/1991  . Smokeless tobacco: Never Used  . Alcohol Use: 1.0 oz/week    2 Standard drinks or equivalent per week     Comment: per week  . Drug Use: No  . Sexual Activity:    Partners: Male     Comment: physician, married, on [employer].   Other Topics Concern  . Not on file   Social History Narrative   Has a son and daughter.  Some exercise.     Health Maintenance  Topic Date Due  . Hepatitis C Screening  11/[birth year]  . HIV Screening  09/02/1979  . MAMMOGRAM  09/04/2014  . PAP SMEAR  09/06/2014  . INFLUENZA VACCINE  06/14/2015  . COLONOSCOPY  09/12/2017  . TETANUS/TDAP  09/06/2021    The following portions of the patient's history were reviewed and updated as appropriate: allergies, current medications, past family history, past medical history, past social history, past surgical history and problem list.  Review of Systems Pertinent items noted in HPI and remainder of comprehensive ROS otherwise negative.   Objective:    BP 136/79 mmHg  Pulse 87  Ht 5\' 4"  (1.626 m)  Wt 141 lb (63.957 kg)  BMI 24.19 kg/m2 General appearance: alert, cooperative and appears stated age Head: Normocephalic, without obvious abnormality, atraumatic Eyes: conj clear, EOMi, PEERLA Ears: normal TM's and external ear canals both ears Nose: Nares normal. Septum midline. Mucosa normal. No drainage or sinus tenderness. Throat: lips, mucosa, and tongue normal; teeth and gums  normal Neck: no adenopathy, no carotid bruit, no JVD, supple, symmetrical, trachea midline and thyroid: enlarged Back: symmetric, no curvature. ROM normal. No CVA tenderness. Lungs: clear to auscultation bilaterally Breasts: normal appearance, no masses or tenderness Heart: regular rate and rhythm, S1, S2 normal, no murmur, click, rub or gallop Abdomen: soft, non-tender; bowel sounds normal; no masses,  no organomegaly Pelvic: cervix normal in appearance, external genitalia normal, no adnexal masses or tenderness, no cervical motion tenderness, rectovaginal septum normal, uterus normal size, shape, and consistency, vagina normal without discharge and mucocele on the cervix Extremities: extremities normal, atraumatic, no cyanosis or edema Pulses: 2+ and symmetric Skin: Skin color, texture, turgor normal. No rashes or lesions Lymph nodes: Cervical, supraclavicular, and axillary nodes normal. Neurologic: Alert and oriented X 3, normal strength and tone. Normal symmetric reflexes. Normal coordination and gait    Assessment:    Healthy female exam.    Plan:     See After Visit Summary for Counseling Recommendations  Keep up a regular exercise program and make sure you are eating a healthy diet Try to eat 4 servings of dairy a day, or if you are lactose intolerant take a calcium with vitamin D daily.  Your vaccines are up to date.   Due for screening mammogram.   Pap smear performed today.     Thyroid nodules-we'll set up for repeat ultrasound.

## 2015-08-24 NOTE — Patient Instructions (Signed)
Keep up a regular exercise program and make sure you are eating a healthy diet Try to eat 4 servings of dairy a day, or if you are lactose intolerant take a calcium with vitamin D daily.  Your vaccines are up to date.   

## 2015-08-25 LAB — COMPLETE METABOLIC PANEL WITH GFR
ALT: 12 U/L (ref 6–29)
AST: 12 U/L (ref 10–35)
Albumin: 3.6 g/dL (ref 3.6–5.1)
Alkaline Phosphatase: 40 U/L (ref 33–130)
BUN: 10 mg/dL (ref 7–25)
CHLORIDE: 98 mmol/L (ref 98–110)
CO2: 27 mmol/L (ref 20–31)
Calcium: 9.1 mg/dL (ref 8.6–10.4)
Creat: 0.64 mg/dL (ref 0.50–1.05)
Glucose, Bld: 85 mg/dL (ref 65–99)
POTASSIUM: 3.9 mmol/L (ref 3.5–5.3)
Sodium: 137 mmol/L (ref 135–146)
Total Bilirubin: 1.6 mg/dL — ABNORMAL HIGH (ref 0.2–1.2)
Total Protein: 6.3 g/dL (ref 6.1–8.1)

## 2015-08-25 LAB — CYTOLOGY - PAP

## 2015-08-25 LAB — LIPID PANEL
CHOL/HDL RATIO: 2 ratio (ref <=5.0)
Cholesterol: 170 mg/dL (ref 125–200)
HDL: 84 mg/dL (ref >=46)
LDL CALC: 74 mg/dL (ref <130)
TRIGLYCERIDES: 59 mg/dL (ref <150)
VLDL: 12 mg/dL (ref <30)

## 2015-08-25 LAB — PTH, INTACT AND CALCIUM
Calcium: 9.1 mg/dL (ref 8.4–10.5)
PTH: 31 pg/mL (ref 14–64)

## 2015-08-25 LAB — TSH: TSH: 0.792 u[IU]/mL (ref 0.350–4.500)

## 2015-08-25 LAB — HIV ANTIBODY (ROUTINE TESTING W REFLEX): HIV: NONREACTIVE

## 2015-08-25 LAB — HEPATITIS C ANTIBODY: HCV Ab: NEGATIVE

## 2015-09-02 ENCOUNTER — Ambulatory Visit (INDEPENDENT_AMBULATORY_CARE_PROVIDER_SITE_OTHER): Payer: PRIVATE HEALTH INSURANCE

## 2015-09-02 DIAGNOSIS — Z1231 Encounter for screening mammogram for malignant neoplasm of breast: Secondary | ICD-10-CM | POA: Diagnosis not present

## 2015-09-02 DIAGNOSIS — E041 Nontoxic single thyroid nodule: Secondary | ICD-10-CM | POA: Diagnosis not present

## 2015-10-01 ENCOUNTER — Encounter: Payer: Self-pay | Admitting: Family Medicine

## 2015-10-01 ENCOUNTER — Ambulatory Visit (INDEPENDENT_AMBULATORY_CARE_PROVIDER_SITE_OTHER): Payer: PRIVATE HEALTH INSURANCE | Admitting: Family Medicine

## 2015-10-01 ENCOUNTER — Ambulatory Visit (INDEPENDENT_AMBULATORY_CARE_PROVIDER_SITE_OTHER): Payer: PRIVATE HEALTH INSURANCE

## 2015-10-01 VITALS — BP 130/82 | HR 95 | Wt 147.0 lb

## 2015-10-01 DIAGNOSIS — M25511 Pain in right shoulder: Secondary | ICD-10-CM

## 2015-10-01 DIAGNOSIS — G8929 Other chronic pain: Secondary | ICD-10-CM

## 2015-10-01 DIAGNOSIS — L719 Rosacea, unspecified: Secondary | ICD-10-CM | POA: Diagnosis not present

## 2015-10-01 DIAGNOSIS — M545 Low back pain, unspecified: Secondary | ICD-10-CM

## 2015-10-01 DIAGNOSIS — M5136 Other intervertebral disc degeneration, lumbar region: Secondary | ICD-10-CM

## 2015-10-01 MED ORDER — AZELAIC ACID 20 % EX CREA: TOPICAL_CREAM | Freq: Two times a day (BID) | CUTANEOUS | Status: DC

## 2015-10-01 NOTE — Assessment & Plan Note (Addendum)
Unclear etiology. X-ray pending of lumbar spine. I suspect she has facet hypertrophy, possibly pars defect, or so spasm and strain of the quadratus lumborum. Plan for physical therapy for core strengthening. Recheck 1 month.  Additionally patient has overall picture concerning for lupus. She's had synovitis and pain in the past and continued pain as well as a rash on her face. Plan for a rheumatologic workup. Labs pending

## 2015-10-01 NOTE — Assessment & Plan Note (Signed)
Skin lesions typical for rosacea. Patient failed metronidazole gel. Trial of azeliaic acid cream 20%

## 2015-10-01 NOTE — Patient Instructions (Signed)
Thank you for coming in today. Call or go to the ER if you develop a large red swollen joint with extreme pain or oozing puss.  Return in 1 month or so.  We will do labs today and call you Monday or Tuesday with results.  Come back or go to the emergency room if you notice new weakness new numbness problems walking or bowel or bladder problems.  Xray today.

## 2015-10-01 NOTE — Assessment & Plan Note (Signed)
Right shoulder pain almost certainly due to subacromial bursitis or rotator cuff tendinitis. Pain much better following injection. Plan physical therapy. Return one month

## 2015-10-01 NOTE — Progress Notes (Signed)
Tracie Schroeder is a 49 y.o. female who presents to [facility]  today for back pain and shoulder pain and rosacea.  1) back pain: Patient notes about 6 months of left-sided low back pain. Pain is present in the morning and is worse with prolonged standing. Pain is improved with lying flat on her back. She denies any significant radiating component. The pain is worse when she flexes to the downward left position. Additionally she has pain worse when she extends to the back and left. She denies any bowel or bladder dysfunction. No fevers chills nausea vomiting or diarrhea. Aleve helps the pain.  Patient is concerned she may have lupus and would like a lupus workup of possible.  2) right shoulder: Continued pain. Patient was seen in September for this issue. She had a trial of home therapy and NSAIDs which have not helped much. Pain is still present with overhand motion and reaching back. No radiating pain weakness or numbness.  3) rosacea: Patient was diagnosed with rosacea especially involving her nose. She was given metronidazole gel last month which has not helped.   Past Medical History  Diagnosis Date  . Axillary lump     RT  . Anemia   . Arthritis   . Thyroid disease     multinodular goiter  . Gluten intolerance   . Thyroid nodule    Past Surgical History  Procedure Laterality Date  . Tonsillectomy    . Tubal ligation    . Uterine ablation    . Hemorrhoid surgery  12/2009   Social History  Substance Use Topics  . Smoking status: Former Smoker    Quit date: 11/14/1991  . Smokeless tobacco: Never Used  . Alcohol Use: 1.0 oz/week    2 Standard drinks or equivalent per week     Comment: per week   family history includes Cancer in her paternal aunt; Diabetes in her father, maternal grandfather, and maternal grandmother; Heart attack in her paternal uncle; Hypertension in her father; Kidney disease in her father; Parkinsonism in her paternal aunt;  Stroke in her paternal aunt; Thyroid disease in her mother and paternal aunt.  ROS as above Medications: Current Outpatient Prescriptions  Medication Sig Dispense Refill  . Cyanocobalamin (B-12) 2500 MCG TABS Take by mouth once a week.     . diclofenac sodium (VOLTAREN) 1 % GEL Apply 2 g topically 4 (four) times daily. To affected joint. 100 g 11  . EPINEPHrine 0.3 mg/0.3 mL IJ SOAJ injection Inject 0.3 mLs (0.3 mg total) into the muscle once. 1 Device 1  . folic acid (FOLVITE) 0.5 MG tablet Take 1 mg by mouth daily.    . pantoprazole (PROTONIX) 40 MG tablet Take 1 tablet by mouth daily 90 tablet 4  . azelaic acid (AZELEX) 20 % cream Apply topically 2 (two) times daily. After skin is thoroughly washed and patted dry, gently but thoroughly massage a thin film of azelaic acid cream into the affected area twice daily, in the morning and evening. 30 g 2   No current facility-administered medications for this visit.   Allergies  Allergen Reactions  . Adhesive [Tape] Itching  . Biaxin [Clarithromycin]   . Other     Almonds and hazelnuts     Exam:  BP 130/82 mmHg  Pulse 95  Wt 147 lb (66.679 kg) Gen: Well NAD HEENT: EOMI,  MMM Lungs: Normal work of breathing. CTABL Heart: RRR no MRG Abd: NABS, Soft. Nondistended, Nontender Exts:  Brisk capillary refill, warm and well perfused.  Skin: Nose is erythematous. Right shoulder: Normal-appearing nontender. Normal motion however pain with abduction arc. Positive Hawkins and Neer's test. Positive empty can test. Positive crossover compression test. Negative Yergason's and speeds test. Strength is intact. Back: Nontender to spinal midline. Tender palpation left lumbar paraspinals and left quadratus lumborum. Normal back range of motion however pain with extension and flexion present in the left low back. Summary strength is intact. Normal gait.  Procedure: Real-time Ultrasound Guided Injection of right subacromial bursa  Device: GE Logiq E   Images permanently stored and available for review in the ultrasound unit. Verbal informed consent obtained. Discussed risks and benefits of procedure. Warned about infection bleeding damage to structures skin hypopigmentation and fat atrophy among others. Patient expresses understanding and agreement Time-out conducted.  Noted no overlying erythema, induration, or other signs of local infection.  Skin prepped in a sterile fashion.  Local anesthesia: Topical Ethyl chloride.  With sterile technique and under real time ultrasound guidance: 40 mg of Kenalog, 3 mL of Marcaine injected easily.  Completed without difficulty  Pain immediately resolved suggesting accurate placement of the medication.  Advised to call if fevers/chills, erythema, induration, drainage, or persistent bleeding.  Images permanently stored and available for review in the ultrasound unit.  Impression: Technically successful ultrasound guided injection.  X-ray right shoulder dated September 2016 reviewed  No results found for this or any previous visit (from the past 24 hour(s)). No results found.   Please see individual assessment and plan sections.

## 2015-10-01 NOTE — Progress Notes (Signed)
Quick Note:  Back xray shows some facer arthritis like we talked about. ______

## 2015-10-02 LAB — CBC WITH DIFFERENTIAL/PLATELET
BASOS PCT: 0 % (ref 0–1)
Basophils Absolute: 0 10*3/uL (ref 0.0–0.1)
EOS ABS: 0.2 10*3/uL (ref 0.0–0.7)
Eosinophils Relative: 3 % (ref 0–5)
HCT: 42.6 % (ref 36.0–46.0)
Hemoglobin: 14 g/dL (ref 12.0–15.0)
Lymphocytes Relative: 28 % (ref 12–46)
Lymphs Abs: 1.4 10*3/uL (ref 0.7–4.0)
MCH: 31.4 pg (ref 26.0–34.0)
MCHC: 32.9 g/dL (ref 30.0–36.0)
MCV: 95.5 fL (ref 78.0–100.0)
MONO ABS: 0.5 10*3/uL (ref 0.1–1.0)
MONOS PCT: 9 % (ref 3–12)
MPV: 9.4 fL (ref 8.6–12.4)
Neutro Abs: 3.1 10*3/uL (ref 1.7–7.7)
Neutrophils Relative %: 60 % (ref 43–77)
PLATELETS: 378 10*3/uL (ref 150–400)
RBC: 4.46 MIL/uL (ref 3.87–5.11)
RDW: 12.6 % (ref 11.5–15.5)
WBC: 5.1 10*3/uL (ref 4.0–10.5)

## 2015-10-02 LAB — COMPREHENSIVE METABOLIC PANEL
ALK PHOS: 42 U/L (ref 33–130)
ALT: 16 U/L (ref 6–29)
AST: 19 U/L (ref 10–35)
Albumin: 3.9 g/dL (ref 3.6–5.1)
BILIRUBIN TOTAL: 1.5 mg/dL — AB (ref 0.2–1.2)
BUN: 14 mg/dL (ref 7–25)
CO2: 29 mmol/L (ref 20–31)
Calcium: 9.2 mg/dL (ref 8.6–10.4)
Chloride: 101 mmol/L (ref 98–110)
Creat: 0.65 mg/dL (ref 0.50–1.05)
Glucose, Bld: 71 mg/dL (ref 65–99)
POTASSIUM: 4.1 mmol/L (ref 3.5–5.3)
Sodium: 139 mmol/L (ref 135–146)
Total Protein: 6.4 g/dL (ref 6.1–8.1)

## 2015-10-02 LAB — RHEUMATOID FACTOR: Rhuematoid fact SerPl-aCnc: <10 IU/mL (ref <=14)

## 2015-10-02 LAB — SEDIMENTATION RATE: SED RATE: 1 mm/h (ref 0–30)

## 2015-10-02 LAB — CK: Total CK: 41 U/L (ref 7–177)

## 2015-10-04 ENCOUNTER — Telehealth: Payer: Self-pay | Admitting: Family Medicine

## 2015-10-04 DIAGNOSIS — R768 Other specified abnormal immunological findings in serum: Secondary | ICD-10-CM

## 2015-10-04 LAB — ANTI-SCLERODERMA ANTIBODY: Scleroderma (Scl-70) (ENA) Antibody, IgG: <1

## 2015-10-04 LAB — ANTI-SMITH ANTIBODY: ENA SM AB SER-ACNC: NEGATIVE

## 2015-10-04 LAB — ANTI-NUCLEAR AB-TITER (ANA TITER): ANA Titer 1: 1:160 {titer} — ABNORMAL HIGH

## 2015-10-04 LAB — ANTI-DNA ANTIBODY, DOUBLE-STRANDED: DS DNA AB: 1 [IU]/mL

## 2015-10-04 LAB — SJOGRENS SYNDROME-B EXTRACTABLE NUCLEAR ANTIBODY: SSB (La) (ENA) Antibody, IgG: <1

## 2015-10-04 LAB — JO-1 ANTIBODY-IGG: JO-1 ANTIBODY, IGG: NEGATIVE

## 2015-10-04 LAB — ANTI-RIBONUCLEIC ACID ANTIBODY: SM/RNP: <1

## 2015-10-04 LAB — CYCLIC CITRUL PEPTIDE ANTIBODY, IGG: Cyclic Citrullin Peptide Ab: <16 Units

## 2015-10-04 LAB — ANA: Anti Nuclear Antibody(ANA): POSITIVE — AB

## 2015-10-04 LAB — SJOGRENS SYNDROME-A EXTRACTABLE NUCLEAR ANTIBODY: SSA (Ro) (ENA) Antibody, IgG: <1

## 2015-10-04 NOTE — Telephone Encounter (Signed)
I called patient with results of recent rheumatologic labs. And a was positive. Refer to rheumatology.

## 2015-10-06 ENCOUNTER — Ambulatory Visit (INDEPENDENT_AMBULATORY_CARE_PROVIDER_SITE_OTHER): Payer: PRIVATE HEALTH INSURANCE | Admitting: Rehabilitative and Restorative Service Providers"

## 2015-10-06 ENCOUNTER — Encounter: Payer: Self-pay | Admitting: Rehabilitative and Restorative Service Providers"

## 2015-10-06 DIAGNOSIS — M25511 Pain in right shoulder: Secondary | ICD-10-CM | POA: Diagnosis not present

## 2015-10-06 DIAGNOSIS — M629 Disorder of muscle, unspecified: Secondary | ICD-10-CM

## 2015-10-06 DIAGNOSIS — R293 Abnormal posture: Secondary | ICD-10-CM | POA: Diagnosis not present

## 2015-10-06 DIAGNOSIS — Z7409 Other reduced mobility: Secondary | ICD-10-CM | POA: Diagnosis not present

## 2015-10-06 DIAGNOSIS — R531 Weakness: Secondary | ICD-10-CM

## 2015-10-06 DIAGNOSIS — M6289 Other specified disorders of muscle: Secondary | ICD-10-CM

## 2015-10-06 DIAGNOSIS — R6889 Other general symptoms and signs: Secondary | ICD-10-CM

## 2015-10-06 NOTE — Therapy (Addendum)
[facility] [place], [place], [address] Phone: [phone]   Fax:  [phone]  Physical Therapy Evaluation  Patient Details  Name: Tracie Schroeder MRN: [id number] Date of Birth: Jul 15, 1966 Referring Provider: Dr. Lynne Leader  Encounter Date: 10/06/2015      PT End of Session - 10/06/15 0704    Visit Number 1   Number of Visits 12   Date for PT Re-Evaluation 11/17/15   PT Start Time 0704   PT Stop Time 0757   PT Time Calculation (min) 53 min   Activity Tolerance Patient tolerated treatment well      Past Medical History  Diagnosis Date  . Axillary lump     RT  . Anemia   . Arthritis   . Thyroid disease     multinodular goiter  . Gluten intolerance   . Thyroid nodule     Past Surgical History  Procedure Laterality Date  . Tonsillectomy    . Tubal ligation    . Uterine ablation    . Hemorrhoid surgery  12/2009    There were no vitals filed for this visit.  Visit Diagnosis:  Pain in joint of right shoulder - Plan: PT plan of care cert/re-cert  Muscular imbalance - Plan: PT plan of care cert/re-cert  Abnormal posture - Plan: PT plan of care cert/re-cert  Decreased strength, endurance, and mobility - Plan: PT plan of care cert/re-cert      Subjective Assessment - 10/06/15 0711    Subjective Patient reports Rt shoulder pain with reaching up or across her body. It is painful to dress; lie on Rt side. Pain present since September 2016 with no known cause of symptoms. Injection 11/30/14 with improvement.    Pertinent History Lumbar pain; arthritis L4/5 facets; Raynauds    How long can you sit comfortably? no limit   How long can you stand comfortably? no limit   How long can you walk comfortably? no limit   Diagnostic tests xrays LB   Currently in Pain? Yes   Pain Score 2    Pain Location Shoulder   Pain Orientation Right;Anterior   Pain Descriptors / Indicators Dull  stabbing with  movement   Pain Type Acute pain   Pain Onset More than a month ago   Pain Frequency Intermittent   Aggravating Factors  dressing; reaching up; reaching back; lying on Rt side   Pain Relieving Factors meds; injection; heat; rest; avoid activities that irritate symptoms             [facility] PT Assessment - 10/06/15 0001    Assessment   Medical Diagnosis Rt shoulder pain    Referring Provider Dr. Lynne Leader   Onset Date/Surgical Date 07/30/15   Hand Dominance Right   Next MD Visit 10/31/15   Prior Therapy none   Precautions   Precautions None  per MD    Precaution Comments hypersensitive to cold - creates hives    Balance Screen   Has the patient fallen in the past 6 months No   Has the patient had a decrease in activity level because of a fear of falling?  No   Is the patient reluctant to leave their home because of a fear of falling?  No   Prior Function   Level of Independence Independent   Vocation Full time employment   Vocation Requirements Psychiratist sitting at desk/computer   Leisure household chores/laundry/child care   Observation/Other Assessments   Focus on  Therapeutic Outcomes (FOTO)  36% lmitation    Sensation   Additional Comments burning into forearms bialt mostly in the morning resolves by ~5 pm    AROM   Right/Left Shoulder --  Rt shoulder pain with all active motions   Right Shoulder Extension 48 Degrees   Right Shoulder Flexion 142 Degrees  pain ant shd   Right Shoulder ABduction 141 Degrees   Right Shoulder Internal Rotation 18 Degrees   Right Shoulder External Rotation 49 Degrees   Left Shoulder Extension 64 Degrees   Left Shoulder Flexion 167 Degrees   Left Shoulder ABduction 170 Degrees   Left Shoulder Internal Rotation 36 Degrees   Left Shoulder External Rotation 91 Degrees   Cervical Flexion 62   Cervical Extension 44   Cervical - Right Side Bend 35   Cervical - Left Side Bend 33   Cervical - Right Rotation 72   Cervical - Left Rotation 77    Strength   Overall Strength Comments UE strength 5/5 except Rt shd ER/IR 5-/5 painful    Palpation   Palpation comment tenderness tightness anterior shoudler                   [facility] Adult PT Treatment/Exercise - 10/06/15 0001    Posture/Postural Control   Posture Comments sits with forward posture; shoulders rounded and elevated; standing head forward; shoulders rounded; head of the humerus anterior in orientation; scapulae abducted and rotated along the thoracic wall    Therapeutic Activites    Therapeutic Activities --  myofacial ball release work    Shoulder Exercises: Standing   Other Standing Exercises scap squeeze with noodle 10 sec x 10    Other Standing Exercises axial extension 10 sec x 10    Shoulder Exercises: Stretch   Corner Stretch Limitations 3 way doorway 20 sec x2 to pain tolerance      Modalities: Moist heat and IFC e-stim to Rt shoulder  E-stim IFC - to tolerance X 15 min  Moist heat to c-spine and Rt shoulder            PT Education - 10/06/15 0748    Education provided Yes   Education Details postural correction; myofacial ball release work; Chiropractor) Educated Patient   Methods Explanation;Demonstration;Tactile cues;Verbal cues;Handout   Comprehension Verbalized understanding;Returned demonstration;Verbal cues required;Tactile cues required             PT Long Term Goals - 10/06/15 0758    PT LONG TERM GOAL #1   Title Improve posture and alignment with pt to deom improved upright posture with better position of scapulae along thoracic wall 10/16/16   Time 6   Period Weeks   Status New   PT LONG TERM GOAL #2   Title Increase AROM Rt shoudler to =/> AROM Lt shd 11/17/15   Time 6   Period Weeks   Status New   PT LONG TERM GOAL #3   Title Decrease pain Rt shd to 0/10 to 2/10 75% of day 11/17/15   Time 6   Period Weeks   Status New   PT LONG TERM GOAL #4   Title Pt I in HEP 11/17/15   Time 6   Period Weeks   Status New    PT LONG TERM GOAL #5   Title Improve FOTO to </=29% limitation 11/17/15   Time 6   Period Weeks   Status New  Plan - 10/06/15 0754    Clinical Impression Statement Pt presents with pain and limited functional activities Rt shoulder for ~3 months duratioin. She has poor posture and alignment; limited shoulder ROM; muscular imbalance shoulder girdle/shoulder; pain with AROM; pain with resisted strength testing; limited functional activity level. Pt will benefit from PT to address problmes identified.    Pt will benefit from skilled therapeutic intervention in order to improve on the following deficits Postural dysfunction;Improper body mechanics;Decreased range of motion;Decreased mobility;Decreased strength;Decreased endurance;Decreased activity tolerance;Pain   Rehab Potential Good   PT Frequency 2x / week   PT Duration 6 weeks   PT Treatment/Interventions Patient/family education;ADLs/Self Care Home Management;Therapeutic exercise;Therapeutic activities;Manual techniques;Dry needling;Electrical Stimulation;Moist Heat;Ultrasound   PT Next Visit Plan progress posturall work; HEP; add manual work as indicated; modalities NO ICE    PT Home Exercise Plan postural work; myofacial ball release work ; Chief Operating Officer with Plan of Care Patient         Problem List Patient Active Problem List   Diagnosis Date Noted  . Lumbago 10/01/2015  . Rosacea 10/01/2015  . Right shoulder pain 07/20/2015  . Radiculitis of left cervical region 06/11/2014  . Gluten intolerance 09/07/2011  . FATIGUE 05/26/2010  . Pain in joint, pelvic region and thigh 05/25/2008  . MASS, RIGHT AXILLA 05/25/2008  . ANEMIA, IRON DEFICIENCY NOS 07/17/2007  . ALLERGIC RHINITIS, CHRONIC 05/27/2007    Celyn Nilda Simmer PT, MPH 10/06/2015, 8:51 AM  [facility] [place], [place], [address] Phone: [phone]   Fax:   [phone]  Name: Tracie Schroeder MRN: [id number] Date of Birth: 10-21-1966

## 2015-10-06 NOTE — Patient Instructions (Signed)
Self massage - using ~4 in rubber ball working through back and arm and chest   Axial Extension (Chin Tuck)    Pull chin in and lengthen back of neck. Hold __10-20__ seconds while counting out loud. Repeat __10__ times. Do __several __ sessions per day.   Shoulder Blade Squeeze    Rotate shoulders back, then squeeze shoulder blades down and back. Hold 10 sec Repeat _10___ times. Do __several__ sessions per day. Can use swim noodle to provide tactile cue  Scapula Adduction With Pectoralis Stretch: Low - Standing   Shoulders at 45 hands even with shoulders, keeping weight through legs, shift weight forward until you feel pull or stretch through the front of your chest. Hold _30__ seconds. Do _3__ times, _2-4__ times per day.   Scapula Adduction With Pectoralis Stretch: Mid-Range - Standing   Shoulders at 90 elbows even with shoulders, keeping weight through legs, shift weight forward until you feel pull or strength through the front of your chest. Hold __30_ seconds. Do _3__ times, __2-4_ times per day.   Scapula Adduction With Pectoralis Stretch: High - Standing   Shoulders at 120 hands up high on the doorway, keeping weight on feet, shift weight forward until you feel pull or stretch through the front of your chest. Hold _30__ seconds. Do _3__ times, _2-3__ times per day.

## 2015-10-11 ENCOUNTER — Ambulatory Visit (INDEPENDENT_AMBULATORY_CARE_PROVIDER_SITE_OTHER): Payer: PRIVATE HEALTH INSURANCE | Admitting: Rehabilitative and Restorative Service Providers"

## 2015-10-11 ENCOUNTER — Encounter: Payer: Self-pay | Admitting: Rehabilitative and Restorative Service Providers"

## 2015-10-11 DIAGNOSIS — Z7409 Other reduced mobility: Secondary | ICD-10-CM | POA: Diagnosis not present

## 2015-10-11 DIAGNOSIS — M25511 Pain in right shoulder: Secondary | ICD-10-CM | POA: Diagnosis not present

## 2015-10-11 DIAGNOSIS — M6289 Other specified disorders of muscle: Secondary | ICD-10-CM

## 2015-10-11 DIAGNOSIS — M629 Disorder of muscle, unspecified: Secondary | ICD-10-CM | POA: Diagnosis not present

## 2015-10-11 DIAGNOSIS — R293 Abnormal posture: Secondary | ICD-10-CM | POA: Diagnosis not present

## 2015-10-11 DIAGNOSIS — R531 Weakness: Secondary | ICD-10-CM

## 2015-10-11 DIAGNOSIS — R6889 Other general symptoms and signs: Secondary | ICD-10-CM

## 2015-10-11 NOTE — Patient Instructions (Signed)

## 2015-10-11 NOTE — Therapy (Signed)
[place], [place], [address] Phone: [phone]   Fax:  [phone]  Physical Therapy Treatment  Patient Details  Name: Tracie Schroeder MRN: [id number] Date of Birth: Feb 18, 1966 Referring Provider: Dr. Lynne Leader  Encounter Date: 10/11/2015      PT End of Session - 10/11/15 0710    Visit Number 2   Number of Visits 12   Date for PT Re-Evaluation 11/17/15   PT Start Time 0707   PT Stop Time 0752   PT Time Calculation (min) 45 min   Activity Tolerance Patient tolerated treatment well      Past Medical History  Diagnosis Date  . Axillary lump     RT  . Anemia   . Arthritis   . Thyroid disease     multinodular goiter  . Gluten intolerance   . Thyroid nodule     Past Surgical History  Procedure Laterality Date  . Tonsillectomy    . Tubal ligation    . Uterine ablation    . Hemorrhoid surgery  12/2009    There were no vitals filed for this visit.  Visit Diagnosis:  Pain in joint of right shoulder  Muscular imbalance  Abnormal posture  Decreased strength, endurance, and mobility      Subjective Assessment - 10/11/15 0708    Subjective Patient reports that her shoulder is feeling "a lot better". She has done her exercises and feels looser. She feels that she has lupus and is awaiting an appt with a reumatologist.    Currently in Pain? Yes   Pain Score 1    Pain Location Shoulder   Pain Orientation Right;Anterior   Pain Descriptors / Indicators Dull   Pain Type Acute pain   Pain Onset More than a month ago            [facility] PT Assessment - 10/11/15 0001    Assessment   Medical Diagnosis Rt shoulder pain    Referring Provider Dr. Lynne Leader   Onset Date/Surgical Date 07/30/15   Hand Dominance Right   Next MD Visit 10/31/15   Prior Therapy none   AROM   Right Shoulder Extension 50 Degrees   Right Shoulder Flexion 154 Degrees   Right Shoulder ABduction 151 Degrees   Right Shoulder Internal Rotation 20 Degrees   Right Shoulder External Rotation 56 Degrees                     [facility] Adult PT Treatment/Exercise - 10/11/15 0001    Shoulder Exercises: Standing   Extension Both;20 reps;Theraband   Theraband Level (Shoulder Extension) Level 1 (Yellow)   Row Both;20 reps;Theraband   Theraband Level (Shoulder Row) Level 1 (Yellow)   Retraction Both;20 reps;Theraband   Theraband Level (Shoulder Retraction) Level 1 (Yellow)   Other Standing Exercises scap squeeze with noodle 10 sec x 10    Other Standing Exercises axial extension 10 sec x 10    Shoulder Exercises: Pulleys   Flexion --  10 sec x 10    Shoulder Exercises: Stretch   Corner Stretch Limitations 3 way doorway 20 sec x2 to pain tolerance    Moist Heat Therapy   Number Minutes Moist Heat 15 Minutes   Moist Heat Location Shoulder;Cervical   Electrical Stimulation   Electrical Stimulation Location shoulder   Electrical Stimulation Action IFC   Electrical Stimulation Parameters to tolerance   Electrical Stimulation Goals Pain   Manual Therapy   Joint  Mobilization GH joint   Soft tissue mobilization pecs/upper trap/leveator   Scapular Mobilization Rt   Passive ROM Rt shd flex/abd/ER/IR/ext                PT Education - 10/11/15 0714    Education provided Yes   Education Details postural correction; TB exercises; HEP   Person(s) Educated Patient   Methods Explanation;Demonstration;Tactile cues;Verbal cues;Handout   Comprehension Verbalized understanding;Returned demonstration;Verbal cues required;Tactile cues required             PT Long Term Goals - 10/06/15 0758    PT LONG TERM GOAL #1   Title Improve posture and alignment with pt to deom improved upright posture with better position of scapulae along thoracic wall 10/16/16   Time 6   Period Weeks   Status New   PT LONG TERM GOAL #2   Title Increase AROM Rt shoudler to =/> AROM Lt shd 11/17/15   Time 6    Period Weeks   Status New   PT LONG TERM GOAL #3   Title Decrease pain Rt shd to 0/10 to 2/10 75% of day 11/17/15   Time 6   Period Weeks   Status New   PT LONG TERM GOAL #4   Title Pt I in HEP 11/17/15   Time 6   Period Weeks   Status New   PT LONG TERM GOAL #5   Title Improve FOTO to </=29% limitation 11/17/15   Time 6   Period Weeks   Status New               Plan - 10/11/15 0710    Clinical Impression Statement Improving symptoms with initial treatment and HEP. Increased ROM noted. Progressing well toward stated goals of therapy. Tolerated TB exercises well.    Pt will benefit from skilled therapeutic intervention in order to improve on the following deficits Postural dysfunction;Improper body mechanics;Decreased range of motion;Decreased mobility;Decreased strength;Decreased endurance;Decreased activity tolerance;Pain   Rehab Potential Good   PT Frequency 2x / week   PT Duration 6 weeks   PT Treatment/Interventions Patient/family education;ADLs/Self Care Home Management;Therapeutic exercise;Therapeutic activities;Manual techniques;Dry needling;Electrical Stimulation;Moist Heat;Ultrasound   PT Next Visit Plan progress posturall work; HEP; manual work as indicated; modalities NO ICE    PT Home Exercise Plan postural work; myofacial ball release work ; Chief Operating Officer with Plan of Care Patient        Problem List Patient Active Problem List   Diagnosis Date Noted  . Lumbago 10/01/2015  . Rosacea 10/01/2015  . Right shoulder pain 07/20/2015  . Radiculitis of left cervical region 06/11/2014  . Gluten intolerance 09/07/2011  . FATIGUE 05/26/2010  . Pain in joint, pelvic region and thigh 05/25/2008  . MASS, RIGHT AXILLA 05/25/2008  . ANEMIA, IRON DEFICIENCY NOS 07/17/2007  . ALLERGIC RHINITIS, CHRONIC 05/27/2007    Celyn Nilda Simmer PT,MPH 10/11/2015, 7:44 AM  [facility] [place],  [place], [address] Phone: [phone]   Fax:  [phone]  Name: Tracie Schroeder MRN: [id number] Date of Birth: Mar 31, 1966

## 2015-10-13 ENCOUNTER — Encounter: Payer: Self-pay | Admitting: Family Medicine

## 2015-10-13 ENCOUNTER — Ambulatory Visit (INDEPENDENT_AMBULATORY_CARE_PROVIDER_SITE_OTHER): Payer: PRIVATE HEALTH INSURANCE | Admitting: Physical Therapy

## 2015-10-13 DIAGNOSIS — Z7409 Other reduced mobility: Secondary | ICD-10-CM

## 2015-10-13 DIAGNOSIS — R6889 Other general symptoms and signs: Secondary | ICD-10-CM

## 2015-10-13 DIAGNOSIS — R531 Weakness: Secondary | ICD-10-CM

## 2015-10-13 DIAGNOSIS — R293 Abnormal posture: Secondary | ICD-10-CM

## 2015-10-13 DIAGNOSIS — M25511 Pain in right shoulder: Secondary | ICD-10-CM | POA: Diagnosis not present

## 2015-10-13 DIAGNOSIS — M6289 Other specified disorders of muscle: Secondary | ICD-10-CM

## 2015-10-13 DIAGNOSIS — R768 Other specified abnormal immunological findings in serum: Secondary | ICD-10-CM | POA: Insufficient documentation

## 2015-10-13 DIAGNOSIS — M629 Disorder of muscle, unspecified: Secondary | ICD-10-CM

## 2015-10-13 NOTE — Patient Instructions (Signed)
Sash    On back, knees bent, feet flat, left hand on left hip, right hand above left. Pull right arm DIAGONALLY (hip to shoulder) across chest. Bring right arm along head toward floor. Hold momentarily. Slowly return to starting position. Thumb up (statue of liberty).  Repeat __10-20_ times. Do with left arm. Band color __yellow____   [facility] [place], [place] [address]  [phone] (office) [phone] (fax)

## 2015-10-13 NOTE — Therapy (Signed)
[place], [place], [address] Phone: [phone]   Fax:  [phone]  Physical Therapy Treatment  Patient Details  Name: Tracie Schroeder MRN: [id number] Date of Birth: 1966/08/12 Referring Provider: Dr. Georgina Snell  Encounter Date: 10/13/2015      PT End of Session - 10/13/15 0704    Visit Number 3   Number of Visits 12   Date for PT Re-Evaluation 11/17/15   PT Start Time 0703   PT Stop Time [id number]   PT Time Calculation (min) 44 min   Activity Tolerance Patient tolerated treatment well      Past Medical History  Diagnosis Date  . Axillary lump     RT  . Anemia   . Arthritis   . Thyroid disease     multinodular goiter  . Gluten intolerance   . Thyroid nodule     Past Surgical History  Procedure Laterality Date  . Tonsillectomy    . Tubal ligation    . Uterine ablation    . Hemorrhoid surgery  12/2009    There were no vitals filed for this visit.  Visit Diagnosis:  Pain in joint of right shoulder  Muscular imbalance  Abnormal posture  Decreased strength, endurance, and mobility      Subjective Assessment - 10/13/15 0704    Subjective Pt reports no new changes since last visit.    Currently in Pain? No/denies  up to [id number] with certain movements            [facility] PT Assessment - 10/13/15 0001    Assessment   Medical Diagnosis Rt shoulder pain    Referring Provider Dr. Georgina Snell   Onset Date/Surgical Date 07/30/15   Hand Dominance Right   Next MD Visit 10/31/15   Prior Therapy none   AROM   Right Shoulder External Rotation 67 Degrees  supine, abd to 90 deg          [facility] Adult PT Treatment/Exercise - 10/13/15 0001    Shoulder Exercises: Supine   Other Supine Exercises Sash yellow band x 20 reps    Shoulder Exercises: Standing   External Rotation Strengthening;Both;10 reps  2 sets, with scap squeeze around pool noodle   Theraband Level (Shoulder External Rotation) Level  1 (Yellow)   Extension Both;10 reps;Theraband  2 sets.  1 set of 10 in supine position as well.   Theraband Level (Shoulder Extension) Level 1 (Yellow)   Row Strengthening;Both;10 reps  2 sets   Theraband Level (Shoulder Row) Level 1 (Yellow)   Shoulder Exercises: Pulleys   Flexion --  10 sec x 10 reps   ABduction 1 minute   Shoulder Exercises: Stretch   Corner Stretch Limitations 3 way doorway 20 sec x2 to pain tolerance; trial of unilateral midlevel stretch x 20 sec each side.    Moist Heat Therapy   Number Minutes Moist Heat 10 Minutes   Moist Heat Location Shoulder  Rt   Electrical Stimulation   Electrical Stimulation Location Rt shoulder   Electrical Stimulation Action IFC   Electrical Stimulation Parameters to tolerance    Electrical Stimulation Goals Pain   Manual Therapy   Manual Therapy Soft tissue mobilization;Joint mobilization;Passive ROM   Manual therapy comments pt hooklying   Joint Mobilization Rt GH ant/post grade I-II   Soft tissue mobilization Rt pec, periscapular musculature    Passive ROM Rt shoulder ER, IR  PT Education - 10/13/15 0743    Education provided Yes   Education Details HEP - added sash   Person(s) Educated Patient   Methods Explanation;Handout   Comprehension Verbalized understanding;Returned demonstration             PT Long Term Goals - 10/06/15 0758    PT LONG TERM GOAL #1   Title Improve posture and alignment with pt to deom improved upright posture with better position of scapulae along thoracic wall 10/16/16   Time 6   Period Weeks   Status New   PT LONG TERM GOAL #2   Title Increase AROM Rt shoudler to =/> AROM Lt shd 11/17/15   Time 6   Period Weeks   Status New   PT LONG TERM GOAL #3   Title Decrease pain Rt shd to 0/10 to 2/10 75% of day 11/17/15   Time 6   Period Weeks   Status New   PT LONG TERM GOAL #4   Title Pt I in HEP 11/17/15   Time 6   Period Weeks   Status New   PT LONG TERM GOAL #5    Title Improve FOTO to </=29% limitation 11/17/15   Time 6   Period Weeks   Status New               Plan - 10/13/15 0742    Clinical Impression Statement Pt demonstrated improved Rt shoulder ER.  Pt tolerated all exercises well, requiring minimal cues for posture and form.  Progressing well towards goals.    Pt will benefit from skilled therapeutic intervention in order to improve on the following deficits Postural dysfunction;Improper body mechanics;Decreased range of motion;Decreased mobility;Decreased strength;Decreased endurance;Decreased activity tolerance;Pain   Rehab Potential Good   PT Frequency 2x / week   PT Duration 6 weeks   PT Treatment/Interventions Patient/family education;ADLs/Self Care Home Management;Therapeutic exercise;Therapeutic activities;Manual techniques;Dry needling;Electrical Stimulation;Moist Heat;Ultrasound   PT Next Visit Plan progress posturall work; HEP; manual work as indicated; modalities NO ICE    Consulted and Agree with Plan of Care Patient        Problem List Patient Active Problem List   Diagnosis Date Noted  . Lumbago 10/01/2015  . Rosacea 10/01/2015  . Right shoulder pain 07/20/2015  . Radiculitis of left cervical region 06/11/2014  . Gluten intolerance 09/07/2011  . FATIGUE 05/26/2010  . Pain in joint, pelvic region and thigh 05/25/2008  . MASS, RIGHT AXILLA 05/25/2008  . ANEMIA, IRON DEFICIENCY NOS 07/17/2007  . ALLERGIC RHINITIS, CHRONIC 05/27/2007    Kerin Perna, PTA 10/13/2015 7:44 AM  [facility] [place], [place], [address] Phone: [phone]   Fax:  [phone]  Name: Tracie Schroeder MRN: [id number] Date of Birth: 20-Jul-1966

## 2015-10-18 ENCOUNTER — Ambulatory Visit (INDEPENDENT_AMBULATORY_CARE_PROVIDER_SITE_OTHER): Payer: PRIVATE HEALTH INSURANCE | Admitting: Physical Therapy

## 2015-10-18 DIAGNOSIS — M25511 Pain in right shoulder: Secondary | ICD-10-CM | POA: Diagnosis not present

## 2015-10-18 DIAGNOSIS — M629 Disorder of muscle, unspecified: Secondary | ICD-10-CM | POA: Diagnosis not present

## 2015-10-18 DIAGNOSIS — M6289 Other specified disorders of muscle: Secondary | ICD-10-CM

## 2015-10-18 DIAGNOSIS — Z7409 Other reduced mobility: Secondary | ICD-10-CM

## 2015-10-18 DIAGNOSIS — R293 Abnormal posture: Secondary | ICD-10-CM

## 2015-10-18 DIAGNOSIS — R6889 Other general symptoms and signs: Secondary | ICD-10-CM

## 2015-10-18 DIAGNOSIS — R531 Weakness: Secondary | ICD-10-CM

## 2015-10-18 NOTE — Therapy (Addendum)
[place], [place], [address] Phone: [phone]   Fax:  [phone]  Physical Therapy Treatment  Patient Details  Name: Tracie Schroeder MRN: [id number] Date of Birth: 02/22/1966 Referring Provider: Dr. Georgina Snell   Encounter Date: 10/18/2015      PT End of Session - 10/18/15 0702    Visit Number 4   Number of Visits 12   Date for PT Re-Evaluation 11/17/15   PT Start Time 0702   PT Stop Time 0748   PT Time Calculation (min) 46 min   Activity Tolerance Patient tolerated treatment well      Past Medical History  Diagnosis Date  . Axillary lump     RT  . Anemia   . Arthritis   . Thyroid disease     multinodular goiter  . Gluten intolerance   . Thyroid nodule     Past Surgical History  Procedure Laterality Date  . Tonsillectomy    . Tubal ligation    . Uterine ablation    . Hemorrhoid surgery  12/2009    There were no vitals filed for this visit.  Visit Diagnosis:  Pain in joint of right shoulder  Muscular imbalance  Abnormal posture  Decreased strength, endurance, and mobility      Subjective Assessment - 10/18/15 0705    Subjective "I'm feeling so good I almost cancelled".  Pt reports when her shoulder bothered her this weekend, she completed her exercises and things eased up. Pt reports she doesn't have pain a majority of the day. Can now sleep on Rt shoulder.  Pt reports she is interested in exercises for her low back, as per referrel.     Pertinent History Lumbar pain; arthritis L4/5 facets; Raynauds    Currently in Pain? No/denies  up to 6/55 with certain motions (IR, ABD)    Pain Score 0-No pain            [facility] PT Assessment - 10/18/15 0001    Assessment   Medical Diagnosis Rt shoulder pain    Referring Provider Dr. Georgina Snell    Onset Date/Surgical Date 07/30/15   Hand Dominance Right   Next MD Visit 10/31/15   Prior Therapy none   Observation/Other Assessments   Focus on Therapeutic Outcomes (FOTO)  16% limited   ROM / Strength   AROM / PROM / Strength Strength;AROM   AROM   AROM Assessment Site Shoulder;Cervical   Right Shoulder Extension 51 Degrees  standing   Right Shoulder Flexion 163 Degrees  standing, slight pain in inferior shoulder    Right Shoulder ABduction 167 Degrees  standing   Right Shoulder Internal Rotation 70 Degrees  supine   Right Shoulder External Rotation 81 Degrees  supine, abd to 90 deg   Cervical - Right Side Bend 51   Cervical - Left Side Bend 45   Strength   Strength Assessment Site Shoulder   Right/Left Shoulder Right   Right Shoulder Internal Rotation 5/5   Right Shoulder External Rotation --  5-/5                     [facility] Adult PT Treatment/Exercise - 10/18/15 0001    Shoulder Exercises: Supine   Other Supine Exercises Sash red band x 10 reps each side    Shoulder Exercises: Prone   Other Prone Exercises trial of childs pose; unable to tolerate this due to increased LBP   Shoulder Exercises: Standing   External  Rotation Strengthening;Both;10 reps  2 sets, with scap squeeze around pool noodle   Theraband Level (Shoulder External Rotation) Level 2 (Red)   Extension Strengthening;Both;10 reps;Theraband   Theraband Level (Shoulder Extension) Level 2 (Red)   Row Strengthening;Both;10 reps;Theraband   Theraband Level (Shoulder Row) Level 2 (Red)   Other Standing Exercises modified childs pose x 20 sec x 2 reps   Shoulder Exercises: Pulleys   Other Pulley Exercises IR RUE x 30 sec holds x 8 reps   Shoulder Exercises: ROM/Strengthening   UBE (Upper Arm Bike) L1: alternating forward/back 4 min total.    Shoulder Exercises: Stretch   Corner Stretch Limitations 3 way doorway 20 sec x2 to pain tolerance; trial of unilateral midlevel stretch x 20 sec each side.    Moist Heat Therapy   Number Minutes Moist Heat 10 Minutes   Moist Heat Location Shoulder  Rt   Electrical Stimulation   Electrical  Stimulation Location Rt shoulder   Electrical Stimulation Action IFC   Electrical Stimulation Parameters to tolerance    Electrical Stimulation Goals Pain                     PT Long Term Goals - 10/18/15 0753    PT LONG TERM GOAL #1   Title Improve posture and alignment with pt to demo improved upright posture with better position of scapulae along thoracic wall 10/16/16   Time 6   Period Weeks   Status On-going   PT LONG TERM GOAL #2   Title Increase AROM Rt shoudler to =/> AROM Lt shd 11/17/15   Time 6   Period Weeks   Status Partially Met   PT LONG TERM GOAL #3   Title Decrease pain Rt shd to 0/10 to 2/10 75% of day 11/17/15   Time 6   Period Weeks   Status Achieved   PT LONG TERM GOAL #4   Title Pt I in HEP 11/17/15   Time 6   Period Weeks   Status On-going   PT LONG TERM GOAL #5   Title Improve FOTO to </=29% limitation 11/17/15   Time 6   Period Weeks   Status Achieved               Plan - 10/18/15 8850    Clinical Impression Statement Pt demonstrated improved cervical and Rt shoulder mobility.  tolerated increased resistance with little to no pain in Rt shoulder, mostly just fatigue.  Pt states she is interested in continuation of HEP for shoulder on own at home.  Pt has partially met LTG #2 goal, met LTG #3, 5 and making great gains towards remaining goals.  Pt interested in receiving therapy for lower back, per referral (not assessed in inital eval), but unsure if she will return prior to MD visit or not.      Pt will benefit from skilled therapeutic intervention in order to improve on the following deficits Postural dysfunction;Improper body mechanics;Decreased range of motion;Decreased mobility;Decreased strength;Decreased endurance;Decreased activity tolerance;Pain   Rehab Potential Good   PT Frequency 2x / week   PT Duration 6 weeks   PT Treatment/Interventions Patient/family education;ADLs/Self Care Home Management;Therapeutic exercise;Therapeutic  activities;Manual techniques;Dry needling;Electrical Stimulation;Moist Heat;Ultrasound   PT Next Visit Plan If pt returns, complete assessment of low back; continue strengthening of Rt shoulder.    Consulted and Agree with Plan of Care Patient        Problem List Patient Active Problem List   Diagnosis Date Noted  .  Positive ANA (antinuclear antibody) 10/13/2015  . Lumbago 10/01/2015  . Rosacea 10/01/2015  . Right shoulder pain 07/20/2015  . Radiculitis of left cervical region 06/11/2014  . Gluten intolerance 09/07/2011  . FATIGUE 05/26/2010  . Pain in joint, pelvic region and thigh 05/25/2008  . MASS, RIGHT AXILLA 05/25/2008  . ANEMIA, IRON DEFICIENCY NOS 07/17/2007  . ALLERGIC RHINITIS, CHRONIC 05/27/2007    Kerin Perna, PTA 10/18/2015 7:59 AM  [place], [place], [address] Phone: [phone]   Fax:  [phone]  Name: Tracie Schroeder MRN: [id number] Date of Birth: 27-Jul-1966    PHYSICAL THERAPY DISCHARGE SUMMARY  Visits from Start of Care: 4  Current functional level related to goals / functional outcomes: See above   Remaining deficits: See above   Education / Equipment: HEP Plan: Patient agrees to discharge.  Patient goals were partially met. Patient is being discharged due to being pleased with the current functional level.  ?????   Jeral Pinch, PT 11/02/2015 12:04 PM

## 2015-11-01 ENCOUNTER — Encounter: Payer: Self-pay | Admitting: Family Medicine

## 2015-11-01 ENCOUNTER — Ambulatory Visit (INDEPENDENT_AMBULATORY_CARE_PROVIDER_SITE_OTHER): Payer: PRIVATE HEALTH INSURANCE | Admitting: Family Medicine

## 2015-11-01 VITALS — BP 125/90 | HR 80 | Wt 150.0 lb

## 2015-11-01 DIAGNOSIS — M545 Low back pain, unspecified: Secondary | ICD-10-CM

## 2015-11-01 DIAGNOSIS — M25511 Pain in right shoulder: Secondary | ICD-10-CM | POA: Diagnosis not present

## 2015-11-01 DIAGNOSIS — R768 Other specified abnormal immunological findings in serum: Secondary | ICD-10-CM | POA: Diagnosis not present

## 2015-11-01 DIAGNOSIS — L719 Rosacea, unspecified: Secondary | ICD-10-CM

## 2015-11-01 DIAGNOSIS — G8929 Other chronic pain: Secondary | ICD-10-CM

## 2015-11-01 MED ORDER — AZELAIC ACID 20 % EX CREA: TOPICAL_CREAM | Freq: Two times a day (BID) | CUTANEOUS | Status: DC

## 2015-11-01 NOTE — Assessment & Plan Note (Signed)
Doing well with physical therapy. Return as needed.

## 2015-11-01 NOTE — Patient Instructions (Signed)
Thank you for coming in today.   Return as needed.    

## 2015-11-01 NOTE — Assessment & Plan Note (Signed)
Prescribed  azelaic acid

## 2015-11-01 NOTE — Assessment & Plan Note (Signed)
Much better with physical therapy. Return as needed.

## 2015-11-01 NOTE — Progress Notes (Signed)
       Tracie Schroeder is a 49 y.o. female who presents to [facility] today for follow-up shoulder pain rosacea and positive ANA.  1) right shoulder pain: Much improved with physical therapy. She notes mild left low back pain as well. This is also improving with physical therapy. Overall she feels much better.  2) rosacea: Patient was thought to have rosacea versus lupus rash. She was prescribed Azeliac Acid cream which she did not fill yet. Because she does not have Lupus she would like the medicine to be re-prescribed.   3) positive ANA: Not lupus per rheumatologist.   Past Medical History  Diagnosis Date  . Axillary lump     RT  . Anemia   . Arthritis   . Thyroid disease     multinodular goiter  . Gluten intolerance   . Thyroid nodule    Past Surgical History  Procedure Laterality Date  . Tonsillectomy    . Tubal ligation    . Uterine ablation    . Hemorrhoid surgery  12/2009   Social History  Substance Use Topics  . Smoking status: Former Smoker    Quit date: 11/14/1991  . Smokeless tobacco: Never Used  . Alcohol Use: 1.0 oz/week    2 Standard drinks or equivalent per week     Comment: per week   family history includes Cancer in her paternal aunt; Diabetes in her father, maternal grandfather, and maternal grandmother; Heart attack in her paternal uncle; Hypertension in her father; Kidney disease in her father; Parkinsonism in her paternal aunt; Stroke in her paternal aunt; Thyroid disease in her mother and paternal aunt.  ROS as above Medications: Current Outpatient Prescriptions  Medication Sig Dispense Refill  . azelaic acid (AZELEX) 20 % cream Apply topically 2 (two) times daily. After skin is thoroughly washed and patted dry, gently but thoroughly massage a thin film of azelaic acid cream into the affected area twice daily, in the morning and evening. 30 g 2  .  Cyanocobalamin (B-12) 2500 MCG TABS Take by mouth once a week.     . diclofenac sodium (VOLTAREN) 1 % GEL Apply 2 g topically 4 (four) times daily. To affected joint. 100 g 11  . EPINEPHrine 0.3 mg/0.3 mL IJ SOAJ injection Inject 0.3 mLs (0.3 mg total) into the muscle once. 1 Device 1  . folic acid (FOLVITE) 0.5 MG tablet Take 1 mg by mouth daily.    . pantoprazole (PROTONIX) 40 MG tablet Take 1 tablet by mouth daily 90 tablet 4   No current facility-administered medications for this visit.   Allergies  Allergen Reactions  . Adhesive [Tape] Itching  . Biaxin [Clarithromycin]   . Other     Almonds and hazelnuts     Exam:  BP 125/90 mmHg  Pulse 80  Wt 150 lb (68.04 kg) Gen: Well NAD HEENT: EOMI,  MMM  Exts: Brisk capillary refill, warm and well perfused.  Skin: Erythematous macular rash on face. Shoulder nontender normal motion normal strength.  No results found for this or any previous visit (from the past 24 hour(s)). No results found.   Please see individual assessment and plan sections.

## 2015-12-15 ENCOUNTER — Telehealth: Payer: Self-pay

## 2015-12-15 MED ORDER — CYCLOBENZAPRINE HCL 10 MG PO TABS: 10.0000 mg | ORAL_TABLET | Freq: Three times a day (TID) | ORAL | Status: DC | PRN

## 2015-12-15 NOTE — Telephone Encounter (Signed)
1) Flexeril ordered.  2) There may be an injection. We may have to get MRI 1st. Return for further eval as needed.

## 2015-12-15 NOTE — Telephone Encounter (Signed)
Pt.notified

## 2015-12-15 NOTE — Telephone Encounter (Signed)
Pt left a VM stating that she is having back pain that we have been treating her for daily and would like to know if you write a rx for cyclobenzaprine (FLEXERIL) 10 MG tablet . She states that Dr. Darene Lamer has written this for her in the past and it seems to help with pain. Pt also would like to know if there is a steroid injection she can get to help with the pain that she is having Please advise. Marland Kitchen

## 2016-02-02 IMAGING — US US SOFT TISSUE HEAD/NECK
1 series · 14 of 25 positions shown · non-contrast
Comparison: None.

CLINICAL DATA: Follow-up thyroid nodules

EXAM:
THYROID ULTRASOUND
TECHNIQUE: Ultrasound examination of the thyroid gland and adjacent soft
tissues was performed.

[Series 1: us soft tissue head/neck · 0.05mm/px · 14 of 73 slices shown]
[im 1/73]
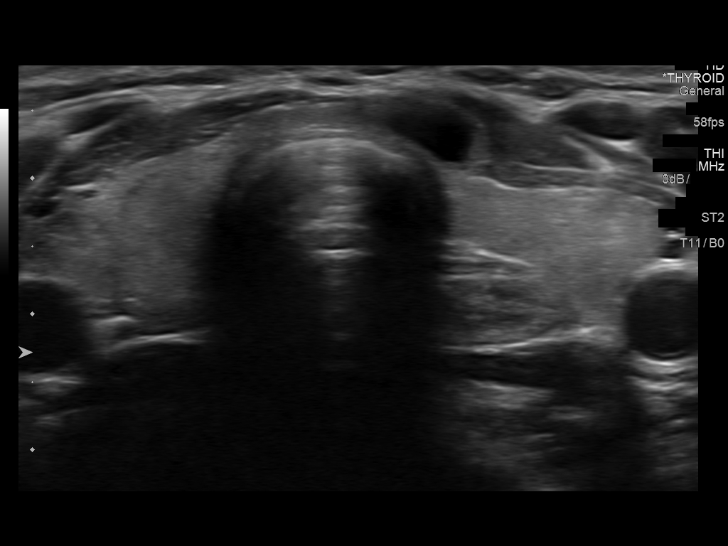
[im 7/73]
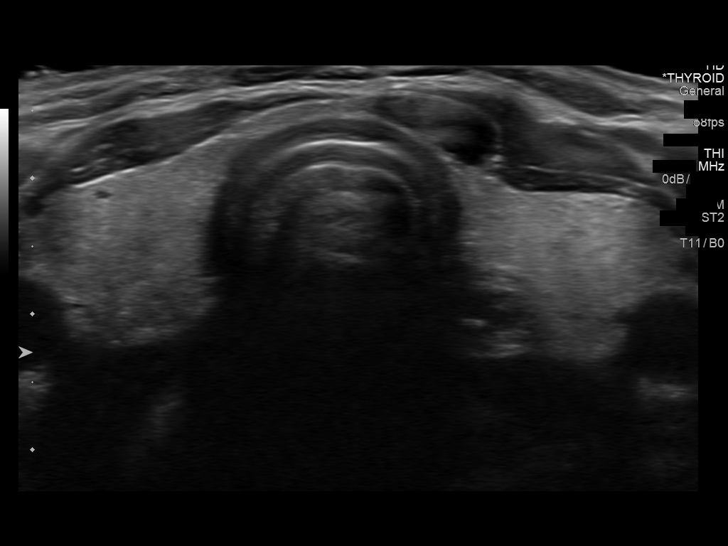
[im 13/73]
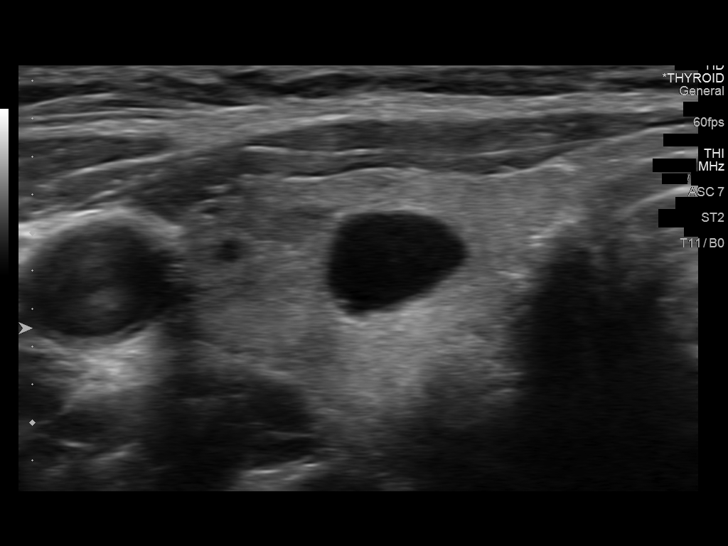
[im 19/73]
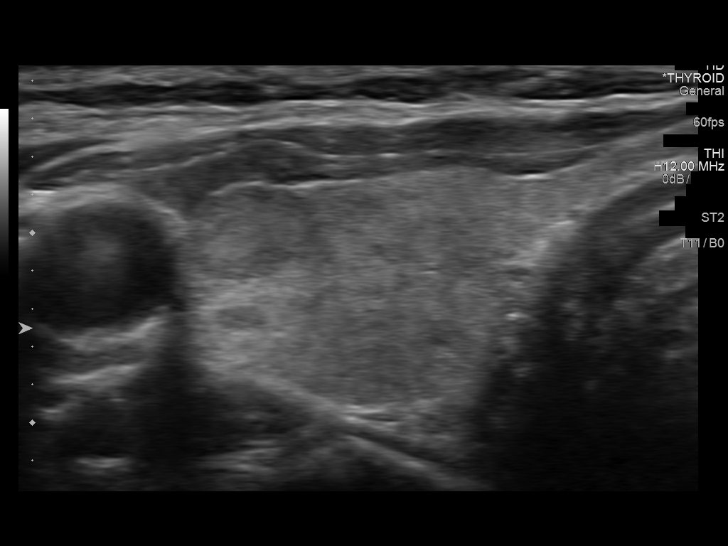
[im 25/73]
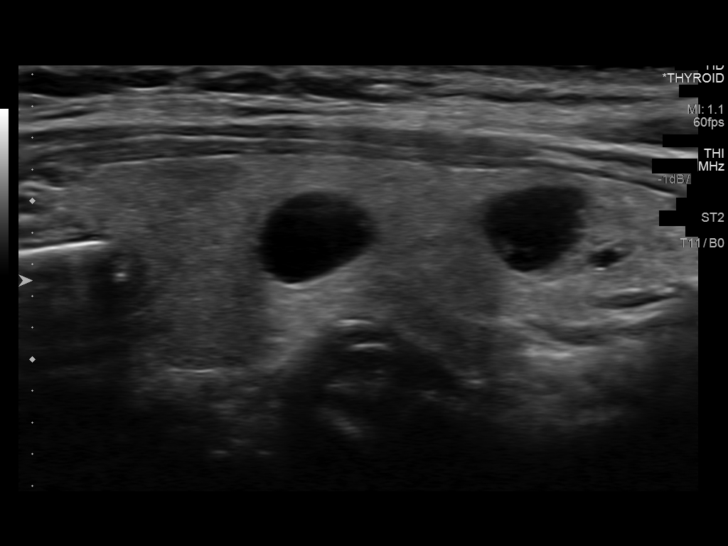
[im 28/73]
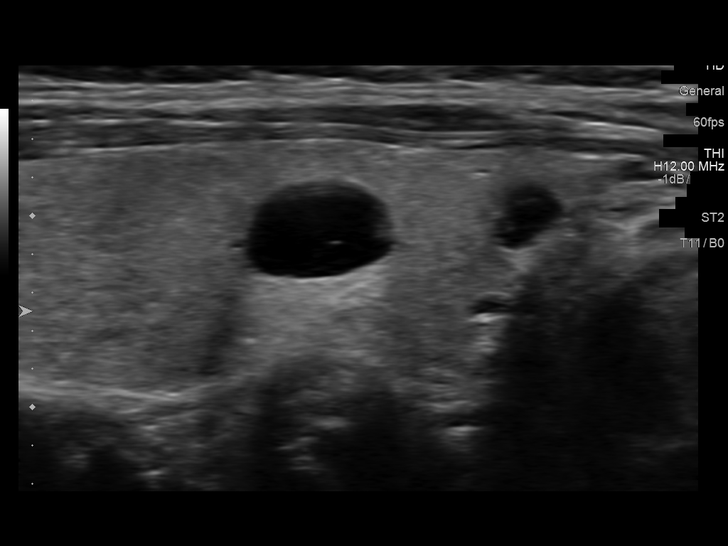
[im 34/73]
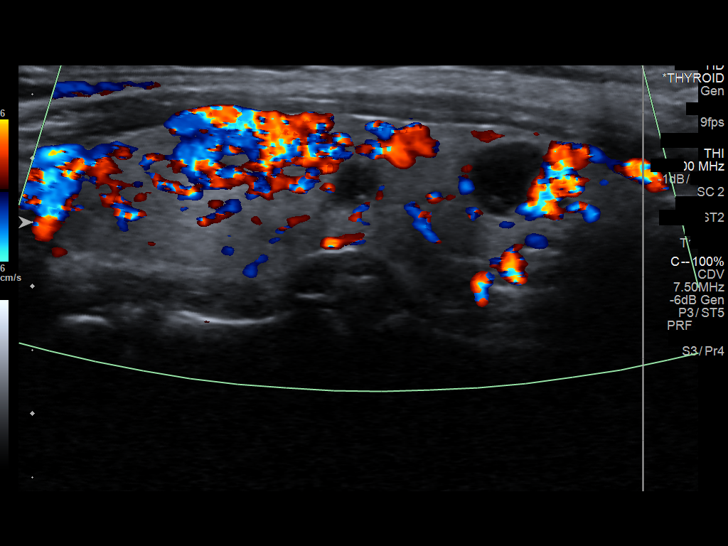
[im 40/73]
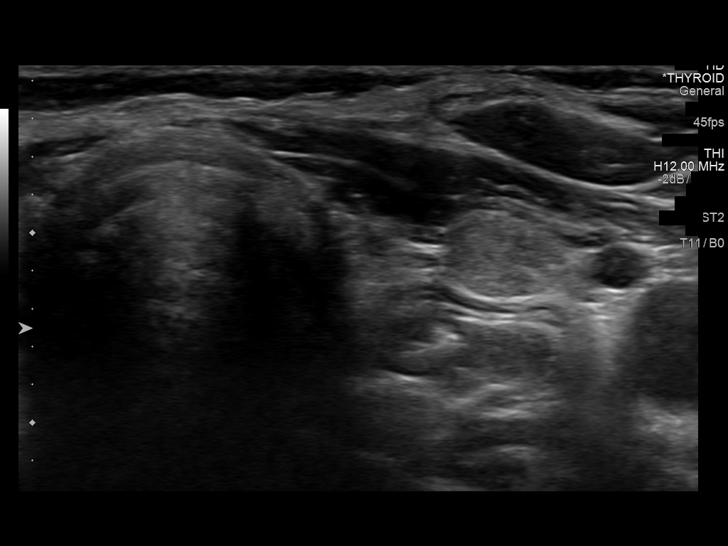
[im 46/73]
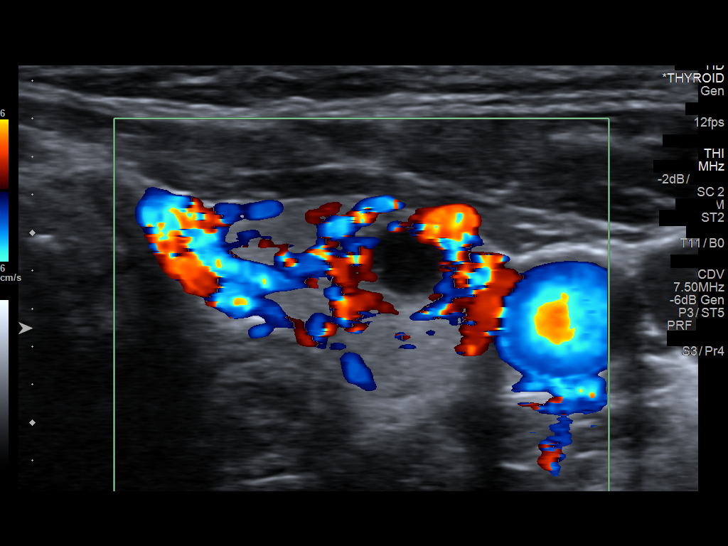
[im 49/73]
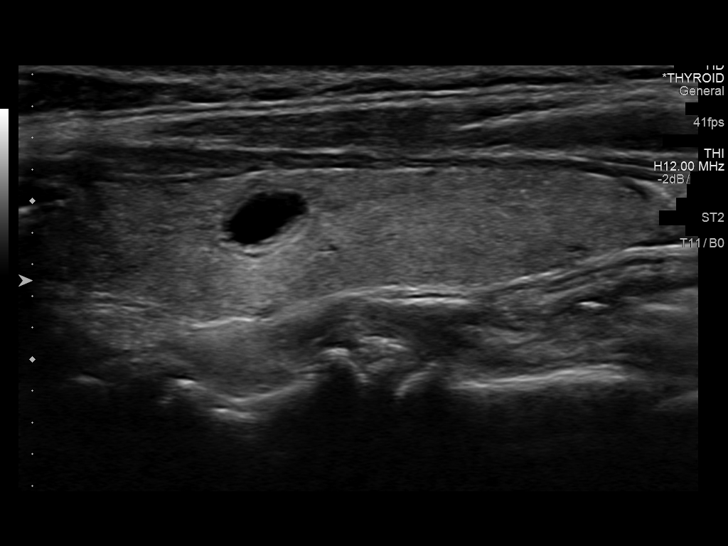
[im 55/73]
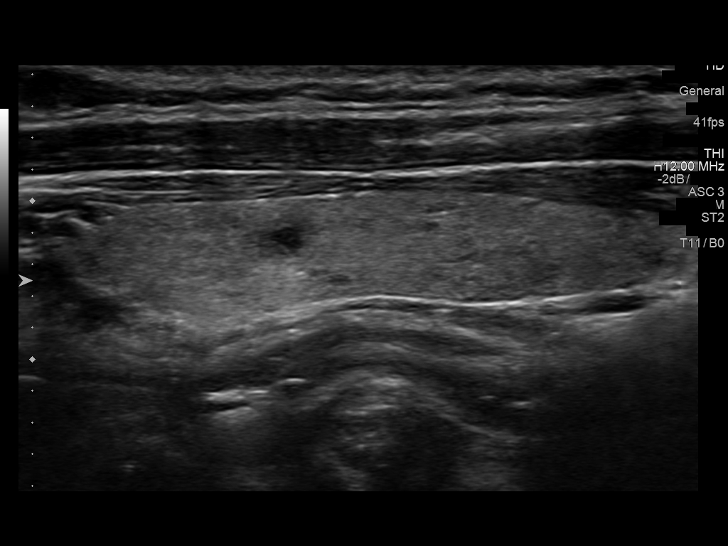
[im 61/73]
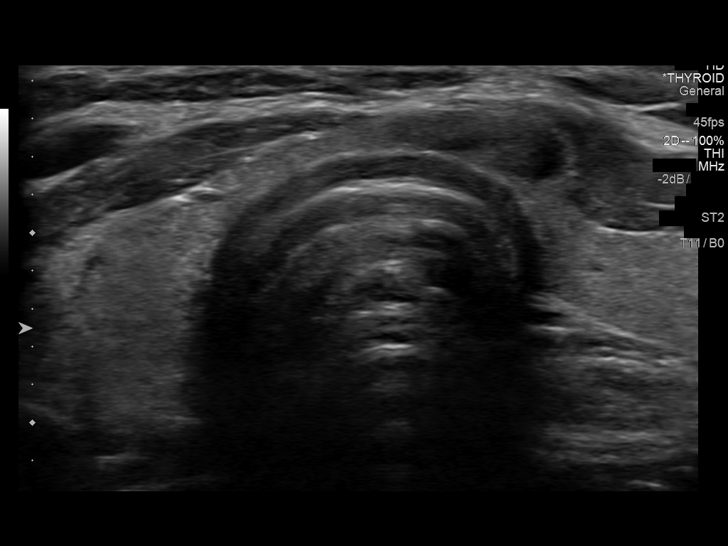
[im 67/73]
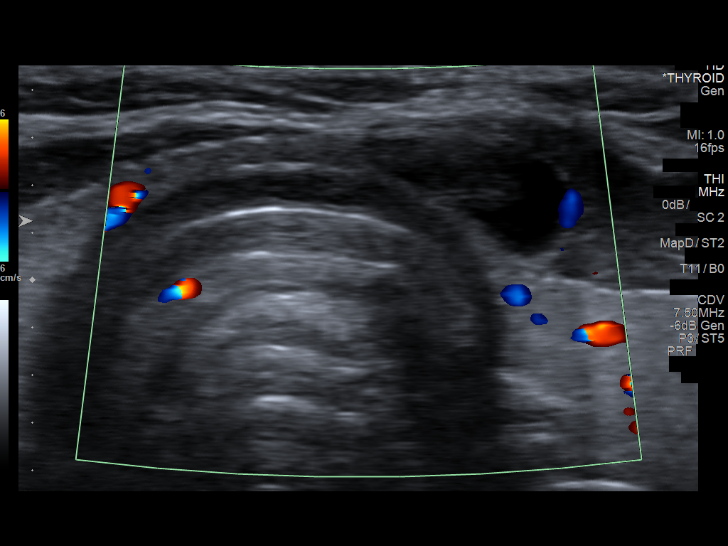
[im 73/73]
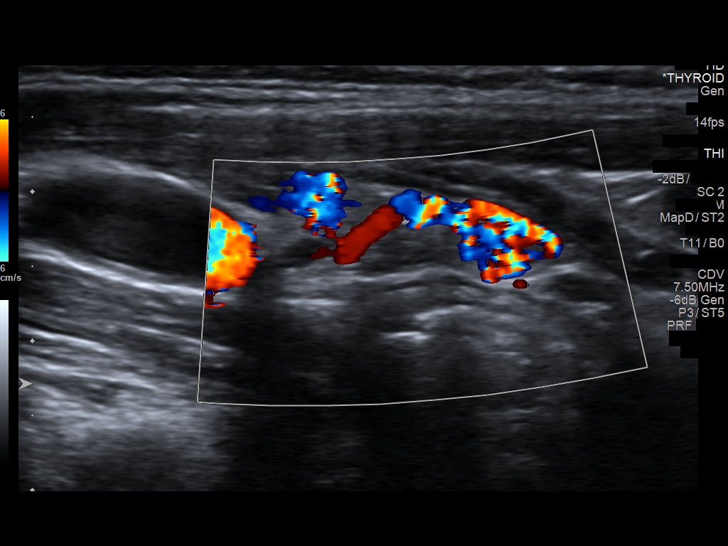

[14 of 25 positions shown; findings below may reference images not displayed]

FINDINGS: Right thyroid lobe

Measurements: 4.7 x 1.6 x 2.1 cm. Complex lower pole lesion measures
11 x 6 x 8 mm and previously measured 14 x 5 x 8 mm. Mid lobe cyst
measures 8 mm. Solid nodule in the upper pole measures 7 mm.

Left thyroid lobe

Measurements: 4.3 x 0.8 x 1.8 cm. Complex mid lesion measures 5 mm.
Solid lesion adjacent to the lower pole measures 10 mm and
previously measured 9 mm.

Isthmus

Thickness: 2 mm. Complex cyst in the left isthmus measures 11 mm and
previously measured 15 mm

Lymphadenopathy

None visualized.
IMPRESSION: Multiple thyroid lesions are either smaller are not significantly
changed. There is a solid nodule adjacent to the lower pole of the
left lobe which is also not significantly changed.

## 2016-09-05 ENCOUNTER — Encounter: Payer: Self-pay | Admitting: Family Medicine

## 2016-09-20 ENCOUNTER — Encounter: Payer: Self-pay | Admitting: Family Medicine

## 2016-09-20 ENCOUNTER — Ambulatory Visit (INDEPENDENT_AMBULATORY_CARE_PROVIDER_SITE_OTHER): Payer: Federal, State, Local not specified - PPO | Admitting: Family Medicine

## 2016-09-20 ENCOUNTER — Other Ambulatory Visit: Payer: Self-pay | Admitting: Family Medicine

## 2016-09-20 ENCOUNTER — Ambulatory Visit: Payer: Federal, State, Local not specified - PPO

## 2016-09-20 VITALS — BP 138/66 | HR 84 | Ht 64.0 in | Wt 140.0 lb

## 2016-09-20 DIAGNOSIS — E042 Nontoxic multinodular goiter: Secondary | ICD-10-CM | POA: Diagnosis not present

## 2016-09-20 DIAGNOSIS — F5101 Primary insomnia: Secondary | ICD-10-CM

## 2016-09-20 DIAGNOSIS — Z0000 Encounter for general adult medical examination without abnormal findings: Secondary | ICD-10-CM | POA: Diagnosis not present

## 2016-09-20 DIAGNOSIS — Z1231 Encounter for screening mammogram for malignant neoplasm of breast: Secondary | ICD-10-CM

## 2016-09-20 DIAGNOSIS — N6310 Unspecified lump in the right breast, unspecified quadrant: Secondary | ICD-10-CM | POA: Diagnosis not present

## 2016-09-20 DIAGNOSIS — M79641 Pain in right hand: Secondary | ICD-10-CM

## 2016-09-20 DIAGNOSIS — Z23 Encounter for immunization: Secondary | ICD-10-CM

## 2016-09-20 DIAGNOSIS — E041 Nontoxic single thyroid nodule: Secondary | ICD-10-CM

## 2016-09-20 LAB — CBC
HCT: 41 % (ref 35.0–45.0)
HEMOGLOBIN: 13.7 g/dL (ref 11.7–15.5)
MCH: 31.4 pg (ref 27.0–33.0)
MCHC: 33.4 g/dL (ref 32.0–36.0)
MCV: 93.8 fL (ref 80.0–100.0)
MPV: 9.7 fL (ref 7.5–12.5)
PLATELETS: 391 10*3/uL (ref 140–400)
RBC: 4.37 MIL/uL (ref 3.80–5.10)
RDW: 13.3 % (ref 11.0–15.0)
WBC: 4.9 10*3/uL (ref 3.8–10.8)

## 2016-09-20 LAB — LIPID PANEL
CHOL/HDL RATIO: 2.9 ratio (ref <5.0)
Cholesterol: 193 mg/dL (ref <200)
HDL: 67 mg/dL (ref >50)
LDL Cholesterol: 109 mg/dL — ABNORMAL HIGH
Triglycerides: 86 mg/dL (ref <150)
VLDL: 17 mg/dL (ref <30)

## 2016-09-20 LAB — COMPLETE METABOLIC PANEL WITH GFR
ALBUMIN: 4 g/dL (ref 3.6–5.1)
ALK PHOS: 46 U/L (ref 33–130)
ALT: 20 U/L (ref 6–29)
AST: 19 U/L (ref 10–35)
BUN: 14 mg/dL (ref 7–25)
CALCIUM: 9.2 mg/dL (ref 8.6–10.4)
CO2: 24 mmol/L (ref 20–31)
Chloride: 104 mmol/L (ref 98–110)
Creat: 0.65 mg/dL (ref 0.50–1.05)
GLUCOSE: 94 mg/dL (ref 65–99)
POTASSIUM: 4.1 mmol/L (ref 3.5–5.3)
SODIUM: 139 mmol/L (ref 135–146)
Total Bilirubin: 0.5 mg/dL (ref 0.2–1.2)
Total Protein: 6.4 g/dL (ref 6.1–8.1)

## 2016-09-20 LAB — TSH: TSH: 0.87 m[IU]/L

## 2016-09-20 MED ORDER — CYCLOBENZAPRINE HCL 10 MG PO TABS: 5.0000 mg | ORAL_TABLET | Freq: Every evening | ORAL | 2 refills | Status: DC | PRN

## 2016-09-20 MED ORDER — TRAMADOL HCL 50 MG PO TABS: 50.0000 mg | ORAL_TABLET | Freq: Three times a day (TID) | ORAL | 0 refills | Status: DC | PRN

## 2016-09-20 MED ORDER — DICLOFENAC SODIUM 1 % TD GEL: 2.0000 g | Freq: Four times a day (QID) | TRANSDERMAL | 11 refills | Status: DC

## 2016-09-20 NOTE — Progress Notes (Signed)
Subjective:     Tracie Schroeder is a 50 y.o. female and is here for a comprehensive physical exam. The patient reports problems - she has been having more joint pain than usual. Says has been having bilat low back pain, bilat shoulder pain,,and pain in bilat pinky fingers. she says it is uncomfortable because if she turns over to sleep on her shoulder she has to turn over and change positions and then her other  shoulder is just sore.  She did bring a copy of her allergy testing that she had done recently with her allergist.  She has switched jobs since I last saw her and now is working at the [employer]. It has been stressful.   She has not been sleeping well at all. She says that typically wake up around 2 or 3:00 in the morning and then be awake for about an hour and a half until she finally feels sleepy and will go back to sleep. She has been under more stress since starting her new job.  Social History   Social History  . Marital status: Married    Spouse name: N/A  . Number of children: 2   . Years of education: N/A   Occupational History  . 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 Smoking status: Former Smoker    Quit date: 11/14/1991  . Smokeless tobacco: Never Used  . Alcohol use 1.0 oz/week    2 Standard drinks or equivalent per week     Comment: per week  . Drug use: No  . Sexual activity: Yes    Partners: Male     Comment: physician, married, on [employer].   Other Topics Concern  . Not on file   Social History Narrative   Has a son and daughter.  Some exercise.     Health Maintenance  Topic Date Due  . MAMMOGRAM  09/01/2016  . COLONOSCOPY  09/12/2017  . PAP SMEAR  08/23/2018  . TETANUS/TDAP  09/06/2021  . INFLUENZA VACCINE  Addressed  . Hepatitis C Screening  Completed  . HIV Screening  Completed    The following portions of the patient's history were reviewed and updated as appropriate: allergies, current medications, past family  history, past medical history, past social history, past surgical history and problem list.  Review of Systems A comprehensive review of systems was negative.   Objective:    BP 138/66   Pulse 84   Ht 5\' 4"  (1.626 m)   Wt 140 lb (63.5 kg)   SpO2 100%   BMI 24.03 kg/m  General appearance: alert, cooperative and appears stated age Head: Normocephalic, without obvious abnormality, atraumatic Eyes: conj clear, EOMI, PEERLA Ears: normal TM's and external ear canals both ears Nose: Nares normal. Septum midline. Mucosa normal. No drainage or sinus tenderness. Throat: lips, mucosa, and tongue normal; teeth and gums normal Neck: no adenopathy, no carotid bruit, no JVD, supple, symmetrical, trachea midline and thyroid not enlarged, symmetric, no tenderness/mass/nodules Back: symmetric, no curvature. ROM normal. No CVA tenderness. Lungs: clear to auscultation bilaterally Breasts: firm irregular tissue at the 12 o'clock position about 2.5 cm from edge of the areola. Approx 2 x 1 cm in size. Heart: regular rate and rhythm, S1, S2 normal, no murmur, click, rub or gallop Abdomen: soft, non-tender; bowel sounds normal; no masses,  no organomegaly Extremities: extremities normal, atraumatic, no cyanosis or edema Pulses: 2+ and symmetric Skin: Skin color, texture,  turgor normal. No rashes or lesions Lymph nodes: Cervical, supraclavicular, and axillary nodes normal. Neurologic: Alert and oriented X 3, normal strength and tone. Normal symmetric reflexes. Normal coordination and gait    Assessment:    Healthy female exam.      Plan:     See After Visit Summary for Counseling Recommendations   Keep up a regular exercise program and make sure you are eating a healthy diet Try to eat 4 servings of dairy a day, or if you are lactose intolerant take a calcium with vitamin D daily.  Your vaccines are up to date.   Insomnia with joint pain - discussed options.  Commend a trial of low-dose Flexeril.  She can take half a tab at bedtime for couple of weeks to see if that helps get her a little bit better rest as well as helping some of her diffuse joint pain.  There are nodules-she's like to have a repeat ultrasound to keep an eye on them and make sure they are not changing in size.  Breast lump-recommend diagnostic mammogram and set of screening as well as possible ultrasound. Order placed for the breast center in [place].

## 2016-09-22 ENCOUNTER — Other Ambulatory Visit: Payer: Self-pay | Admitting: Family Medicine

## 2016-09-25 ENCOUNTER — Other Ambulatory Visit: Payer: Self-pay

## 2016-09-25 DIAGNOSIS — N6310 Unspecified lump in the right breast, unspecified quadrant: Secondary | ICD-10-CM

## 2016-10-12 ENCOUNTER — Ambulatory Visit
Admission: RE | Admit: 2016-10-12 | Discharge: 2016-10-12 | Disposition: A | Discharge status: Home / Self Care | Payer: Federal, State, Local not specified - PPO | Source: Ambulatory Visit | Attending: Family Medicine | Admitting: Family Medicine

## 2016-10-12 DIAGNOSIS — N6310 Unspecified lump in the right breast, unspecified quadrant: Secondary | ICD-10-CM

## 2016-10-12 DIAGNOSIS — N6489 Other specified disorders of breast: Secondary | ICD-10-CM | POA: Diagnosis not present

## 2016-10-12 DIAGNOSIS — R922 Inconclusive mammogram: Secondary | ICD-10-CM | POA: Diagnosis not present

## 2016-10-12 DIAGNOSIS — K080 Exfoliation of teeth due to systemic causes: Secondary | ICD-10-CM | POA: Diagnosis not present

## 2016-11-09 ENCOUNTER — Ambulatory Visit (INDEPENDENT_AMBULATORY_CARE_PROVIDER_SITE_OTHER): Payer: Federal, State, Local not specified - PPO | Admitting: Sports Medicine

## 2016-11-09 ENCOUNTER — Encounter: Payer: Self-pay | Admitting: Sports Medicine

## 2016-11-09 DIAGNOSIS — M7552 Bursitis of left shoulder: Secondary | ICD-10-CM | POA: Diagnosis not present

## 2016-11-09 NOTE — Addendum Note (Signed)
Addended by: Elizabeth Sauer on: 11/09/2016 10:10 AM   Modules accepted: Orders

## 2016-11-09 NOTE — Assessment & Plan Note (Signed)
Had a similar issue on the right shoulder that responded well to an injection by Dr. Georgina Snell. Now having a recurrence of pain, left-sided, impingement related, injected as above. She will do the home rehabilitation exercises that she learned in formal physical therapy and return to see me in one month.

## 2016-11-09 NOTE — Progress Notes (Signed)
   Subjective:    I'm seeing this patient as a consultation for:  Dr. Beatrice Lecher  CC: Left shoulder pain  HPI: This is a pleasant 50 year old female, last her she had right shoulder pain, I noticed his subacromial bursitis and appropriately injected by Dr. Georgina Snell, she had good relief. She's having a new pain on a left shoulder, anterolateral, worse with overhead activities, no trauma. Pain is moderate, persistent, localized without radiation and she desires interventional treatment today.  Past medical history:  Negative.  See flowsheet/record as well for more information.  Surgical history: Negative.  See flowsheet/record as well for more information.  Family history: Negative.  See flowsheet/record as well for more information.  Social history: Negative.  See flowsheet/record as well for more information.  Allergies, and medications have been entered into the medical record, reviewed, and no changes needed.   Review of Systems: No headache, visual changes, nausea, vomiting, diarrhea, constipation, dizziness, abdominal pain, skin rash, fevers, chills, night sweats, weight loss, swollen lymph nodes, body aches, joint swelling, muscle aches, chest pain, shortness of breath, mood changes, visual or auditory hallucinations.   Objective:   General: Well Developed, well nourished, and in no acute distress.  Neuro/Psych: Alert and oriented x3, extra-ocular muscles intact, able to move all 4 extremities, sensation grossly intact. Skin: Warm and dry, no rashes noted.  Respiratory: Not using accessory muscles, speaking in full sentences, trachea midline.  Cardiovascular: Pulses palpable, no extremity edema. Abdomen: Does not appear distended. Left Shoulder: Inspection reveals no abnormalities, atrophy or asymmetry. Palpation is normal with no tenderness over AC joint or bicipital groove. ROM is full in all planes. Rotator cuff strength normal throughout. Positive Neer and Hawkin's tests,  empty can. Speeds and Yergason's tests normal. No labral pathology noted with negative Obrien's, negative crank, negative clunk, and good stability. Normal scapular function observed. No painful arc and no drop arm sign. No apprehension sign  Procedure: Real-time Ultrasound Guided Injection of left subacromial bursa Device: GE Logiq E  Verbal informed consent obtained.  Time-out conducted.  Noted no overlying erythema, induration, or other signs of local infection.  Skin prepped in a sterile fashion.  Local anesthesia: Topical Ethyl chloride.  With sterile technique and under real time ultrasound guidance: Using a 25-gauge needle advanced over the rotator cuff, entered the bursa and injected 1 mL kenalog 40, 1 mL lidocaine, 1 mL Marcaine. Completed without difficulty  Pain immediately resolved suggesting accurate placement of the medication.  Advised to call if fevers/chills, erythema, induration, drainage, or persistent bleeding.  Images permanently stored and available for review in the ultrasound unit.  Impression: Technically successful ultrasound guided injection.  Impression and Recommendations:   This case required medical decision making of moderate complexity.  Subacromial bursitis of left shoulder joint Had a similar issue on the right shoulder that responded well to an injection by Dr. Georgina Snell. Now having a recurrence of pain, left-sided, impingement related, injected as above. She will do the home rehabilitation exercises that she learned in formal physical therapy and return to see me in one month.

## 2016-12-07 DIAGNOSIS — K080 Exfoliation of teeth due to systemic causes: Secondary | ICD-10-CM | POA: Diagnosis not present

## 2017-01-04 DIAGNOSIS — K080 Exfoliation of teeth due to systemic causes: Secondary | ICD-10-CM | POA: Diagnosis not present

## 2017-03-22 DIAGNOSIS — K080 Exfoliation of teeth due to systemic causes: Secondary | ICD-10-CM | POA: Diagnosis not present

## 2017-04-11 ENCOUNTER — Ambulatory Visit (INDEPENDENT_AMBULATORY_CARE_PROVIDER_SITE_OTHER): Payer: Federal, State, Local not specified - PPO | Admitting: Family Medicine

## 2017-04-11 VITALS — BP 141/84 | HR 84 | Temp 98.2°F | Wt 145.0 lb

## 2017-04-11 DIAGNOSIS — M7552 Bursitis of left shoulder: Secondary | ICD-10-CM

## 2017-04-11 NOTE — Progress Notes (Signed)
Pt had steroid shot in November, and the pain never completely went away.

## 2017-04-11 NOTE — Progress Notes (Signed)
Tracie Schroeder is a 51 y.o. female who presents to [place] today for left shoulder pain.  Tracie Schroeder has had left shoulder pain for several months. She notes pain with her shoulder in an abducted and externally rotated position, with reaching up and reaching back. She notes that the pain is located in the lateral upper arm. She also notes pain at night. She denies any radiating pain weakness or numbness. She saw my partner in December 2017 where she received a subacromial injection. This helped temporarily but has not provided long-lasting relief. She denies any significant injury. She feels well otherwise with no fevers or chills.   Past Medical History:  Diagnosis Date  . Anemia   . Arthritis   . Axillary lump    RT  . Gluten intolerance   . Thyroid disease    multinodular goiter  . Thyroid nodule    Past Surgical History:  Procedure Laterality Date  . HEMORRHOID SURGERY  12/2009  . TONSILLECTOMY    . TUBAL LIGATION    . uterine ablation     Social History  Substance Use Topics  . Smoking status: Former Smoker    Quit date: 11/14/1991  . Smokeless tobacco: Never Used  . Alcohol use 1.0 oz/week    2 Standard drinks or equivalent per week     Comment: per week     ROS:  As above   Medications: Current Outpatient Prescriptions  Medication Sig Dispense Refill  . azelaic acid (AZELEX) 20 % cream Apply topically 2 (two) times daily. After skin is thoroughly washed and patted dry, gently but thoroughly massage a thin film of azelaic acid cream into the affected area twice daily, in the morning and evening. (Patient not taking: Reported on 04/11/2017) 30 g 2  . cyclobenzaprine (FLEXERIL) 10 MG tablet Take 0.5-1 tablets (5-10 mg total) by mouth at bedtime as needed for muscle spasms. 30 tablet 2  . diclofenac sodium (VOLTAREN) 1 % GEL Apply 2 g topically 4 (four) times daily. To affected joint. 400 g 11  . EPINEPHrine 0.3 mg/0.3 mL  IJ SOAJ injection Inject 0.3 mLs (0.3 mg total) into the muscle once. 1 Device 1  . Hyaluronic Acid-Vitamin C (HYALURONIC ACID PO) Take 166 mg by mouth 2 (two) times daily.    Marland Kitchen loratadine (CLARITIN) 10 MG tablet Take 10 mg by mouth daily.    . meloxicam (MOBIC) 15 MG tablet Take 15 mg by mouth as needed for pain.    . Methylcobalamin 2500 MCG SUBL Place 1 tablet under the tongue once a week.    . pantoprazole (PROTONIX) 40 MG tablet TAKE 1 TABLET BY MOUTH EVERY DAY 90 tablet 2  . traMADol (ULTRAM) 50 MG tablet Take 1 tablet (50 mg total) by mouth every 8 (eight) hours as needed. 45 tablet 0  . Ubiquinol 100 MG CAPS Take 1 capsule by mouth daily.     No current facility-administered medications for this visit.    Allergies  Allergen Reactions  . Adhesive [Tape] Itching  . Biaxin [Clarithromycin]   . Other     Almonds and hazelnuts     Exam:  BP (!) 141/84 (BP Location: Right Arm, Patient Position: Sitting, Cuff Size: Normal)   Pulse 84   Temp 98.2 F (36.8 C) (Oral)   Wt 145 lb (65.8 kg)   SpO2 100%   BMI 24.89 kg/m  General: Well Developed, well nourished, and in no acute distress.  Neuro/Psych:  Alert and oriented x3, extra-ocular muscles intact, able to move all 4 extremities, sensation grossly intact. Skin: Warm and dry, no rashes noted.  Respiratory: Not using accessory muscles, speaking in full sentences, trachea midline.  Cardiovascular: Pulses palpable, no extremity edema. Abdomen: Does not appear distended. MSK:  Left shoulder is normal-appearing No significant tenderness to palpation. Range of motion external rotation is limited to 80 and internal rotation to the lumbar spine. This is limited in both external and internal rotation compared to the contralateral right side. She lacks about 30 of motion total. She has normal abduction and forward flexion motion. Strength is slightly diminished in abduction due to pain 4+/5. Otherwise motion is normal. Mildly positive  empty can test. Mildly positive Hawkins and Neer's test. Negative Yergason's and speeds test.  Pulses capillary refill and sensation are intact distally.  Procedure: Real-time Ultrasound Guided Injection of left subacromial space  Device: GE Logiq E  Images permanently stored and available for review in the ultrasound unit. Verbal informed consent obtained. Discussed risks and benefits of procedure. Warned about infection bleeding damage to structures skin hypopigmentation and fat atrophy among others. Patient expresses understanding and agreement Time-out conducted.  Noted no overlying erythema, induration, or other signs of local infection.  Skin prepped in a sterile fashion.  Local anesthesia: Topical Ethyl chloride.  With sterile technique and under real time ultrasound guidance: 40mg  kenalog and 58ml marcaine injected easily.  Completed without difficulty  Pain mostly resolved suggesting accurate placement of the medication.  Advised to call if fevers/chills, erythema, induration, drainage, or persistent bleeding.  Images permanently stored and available for review in the ultrasound unit.  Impression: Technically successful ultrasound guided injection.      No results found for this or any previous visit (from the past 48 hour(s)). No results found.    Assessment and Plan: 51 y.o. female with  Left shoulder pain.  Based on response to injection and physical exam I do think the patient has subacromial bursitis versus rotator cuff tendinopathy. I do however think that there may be a component of some adhesive capsulitis. She has lost some motion and did have some residual pain following the injection. She does have some risk factors including positive ANA as well as thyroid disease. Plan for trial of physical therapy. Recheck in 4-6 weeks if not better would probably proceed with x-ray and glenohumeral injection.    Orders Placed This Encounter  Procedures  .  Ambulatory referral to Physical Therapy    Referral Priority:   Routine    Referral Type:   Physical Medicine    Referral Reason:   Specialty Services Required    Requested Specialty:   Physical Therapy    Number of Visits Requested:   1   No orders of the defined types were placed in this encounter.   Discussed warning signs or symptoms. Please see discharge instructions. Patient expresses understanding.

## 2017-04-11 NOTE — Patient Instructions (Addendum)
Thank you for coming in today. Call or go to the ER if you develop a large red swollen joint with extreme pain or oozing puss.  Recheck as needed.   Attend PT.  Work on motion.  Recheck in 4-6 weeks or sooner if needed.     Adhesive Capsulitis Adhesive capsulitis is inflammation of the tendons and ligaments that surround the shoulder joint (shoulder capsule). This condition causes the shoulder to become stiff and painful to move. Adhesive capsulitis is also called frozen shoulder. What are the causes? This condition may be caused by:  An injury to the shoulder joint.  Straining the shoulder.  Not moving the shoulder for a period of time. This can happen if your arm was injured or in a sling.  Long-standing health problems, such as:  Diabetes.  Thyroid problems.  Heart disease.  Stroke.  Rheumatoid arthritis.  Lung disease. In some cases, the cause may not be known. What increases the risk? This condition is more likely to develop in:  Women.  People who are older than 53 years of age. What are the signs or symptoms? Symptoms of this condition include:  Pain in the shoulder when moving the arm. There may also be pain when parts of the shoulder are touched. The pain is worse at night or when at rest.  Soreness or aching in the shoulder.  Inability to move the shoulder normally.  Muscle spasms. How is this diagnosed? This condition is diagnosed with a physical exam and imaging tests, such as an X-ray or MRI. How is this treated? This condition may be treated with:  Treatment of the underlying cause or condition.  Physical therapy. This involves performing exercises to get the shoulder moving again.  Medicine. Medicine may be given to relieve pain, inflammation, or muscle spasms.  Steroid injections into the shoulder joint.  Shoulder manipulation. This is a procedure to move the shoulder into another position. It is done after you are given a medicine to  make you fall asleep (general anesthetic). The joint may also be injected with salt water at high pressure to break down scarring.  Surgery. This may be done in severe cases when other treatments have failed. Although most people recover completely from adhesive capsulitis, some may not regain the full movement of the shoulder. Follow these instructions at home:  Take over-the-counter and prescription medicines only as told by your health care provider.  If you are being treated with physical therapy, follow instructions from your physical therapist.  Avoid exercises that put a lot of demand on your shoulder, such as throwing. These exercises can make pain worse.  If directed, apply ice to the injured area:  Put ice in a plastic bag.  Place a towel between your skin and the bag.  Leave the ice on for 20 minutes, 2-3 times per day. Contact a health care provider if:  You develop new symptoms.  Your symptoms get worse. This information is not intended to replace advice given to you by your health care provider. Make sure you discuss any questions you have with your health care provider. Document Released: 08/27/2009 Document Revised: 04/06/2016 Document Reviewed: 02/22/2015 Elsevier Interactive Patient Education  2017 [employer].

## 2017-04-12 DIAGNOSIS — K080 Exfoliation of teeth due to systemic causes: Secondary | ICD-10-CM | POA: Diagnosis not present

## 2017-04-18 ENCOUNTER — Encounter: Payer: Self-pay | Admitting: Physical Therapy

## 2017-04-18 ENCOUNTER — Ambulatory Visit (INDEPENDENT_AMBULATORY_CARE_PROVIDER_SITE_OTHER): Payer: Federal, State, Local not specified - PPO | Admitting: Physical Therapy

## 2017-04-18 DIAGNOSIS — M25512 Pain in left shoulder: Secondary | ICD-10-CM

## 2017-04-18 DIAGNOSIS — M6281 Muscle weakness (generalized): Secondary | ICD-10-CM

## 2017-04-18 NOTE — Patient Instructions (Addendum)
ELBOW: Biceps - Standing - perform with both arms at one time.  - Keep chest lifted and shoulders back for all exercise.     Standing in doorway, place one hand on wall, elbow straight. Take a small step forward. Hold _45__ seconds. _1__ reps per set, _1__ sets per day, __7_ days per week  Strengthening: Resisted External Rotation - towel under elbow    Hold tubing in left hand, elbow at side and forearm across body. Rotate forearm out. Repeat _10__ times per set. Do __3__ sets per session. Do __1__ sessions per day.  Strengthening: Resisted Internal Rotation    Hold tubing in left hand, elbow at side and forearm out. Rotate forearm in across body. Repeat __10__ times per set. Do __3__ sets per session. Do __1__ sessions per day.  EXTENSION: Standing - Resistance Band: Stable (Active)   Stand, hold red resistance band in both hands, draw arms backward, squeezing shoulder blades together, keeping elbow straight. Complete _10__ sets of _3__ repetitions. Perform __1_ sessions per day.

## 2017-04-18 NOTE — Therapy (Signed)
[address] [place], [place], [address] Phone: [phone]   Fax:  [phone]  Physical Therapy Evaluation  Patient Details  Name: Tracie Schroeder MRN: [id number] Date of Birth: 06/26/1966 Referring Provider: Dr Steva Colder  Encounter Date: 04/18/2017      PT End of Session - 04/18/17 0711    Visit Number 1   Number of Visits 4   Date for PT Re-Evaluation 05/16/17   PT Start Time 0714   PT Stop Time 0746   PT Time Calculation (min) 32 min   Activity Tolerance Patient tolerated treatment well;No increased pain      Past Medical History:  Diagnosis Date  . Anemia   . Arthritis   . Axillary lump    RT  . Gluten intolerance   . Thyroid disease    multinodular goiter  . Thyroid nodule     Past Surgical History:  Procedure Laterality Date  . HEMORRHOID SURGERY  12/2009  . TONSILLECTOMY    . TUBAL LIGATION    . uterine ablation      There were no vitals filed for this visit.       Subjective Assessment - 04/18/17 0713    Subjective Pt reports she noticed Lt shoulder pain starting in the fall of last year.  She had an injection in Nov with some relief however the pain increased and she had another injection last week.  She feels like this current one helped more. She tried doing some exercise after the first injection and it didnt' help.    Patient Stated Goals full ROM of Lt shoulder and eliminate the pain   Currently in Pain? No/denies  has pain only with reaching high over head and ER her Lt shoulder            [facility] PT Assessment - 04/18/17 0001      Assessment   Medical Diagnosis Lt shoulder bursitis   Referring Provider Dr Steva Colder   Onset Date/Surgical Date 09/18/16   Hand Dominance Right   Next MD Visit 05/10/17   Prior Therapy not for Lt shoulder, Rt shoulder 2 yrs ago     Precautions   Precautions None     Balance Screen   Has the patient fallen in the past 6 months No      [place] residence     Prior Function   Level of Independence Independent  was having trouble with bra before injection   Vocation Full time employment   Youth worker   Leisure vacation and beach, watch movies     Observation/Other Assessments   Focus on Therapeutic Outcomes (FOTO)  14% limited     Posture/Postural Control   Posture/Postural Control Postural limitations     ROM / Strength   AROM / PROM / Strength AROM;Strength     AROM   AROM Assessment Site Cervical;Shoulder   Right/Left Shoulder Left;Right   Right Shoulder Internal Rotation 56 Degrees   Right Shoulder External Rotation 92 Degrees   Left Shoulder Flexion --  WNL   Left Shoulder ABduction --  WNL, pain at endrange   Left Shoulder Internal Rotation 20 Degrees  with pain, measured in standing   Left Shoulder External Rotation 77 Degrees  with pain   Cervical Flexion WNL   Cervical Extension WNL   Cervical - Right Side Bend WNL   Cervical - Left Side Bend WNL   Cervical -  Right Rotation WNL   Cervical - Left Rotation WNL     Strength   Overall Strength Comments mid traps 4-/5 with pain   Strength Assessment Site Shoulder;Elbow   Right/Left Shoulder Left  pain Lt endrange abd/flex   Left Shoulder Flexion --  5-/5   Left Shoulder ABduction --  5-/5   Left Shoulder Internal Rotation 4+/5   Left Shoulder External Rotation 4/5     Flexibility   Soft Tissue Assessment /Muscle Length --  some tightness in bilat pecs     Palpation   Palpation comment palpable trigger point posteior LT shoulder teres minor area     Special Tests    Special Tests Rotator Cuff Impingement   Rotator Cuff Impingment tests Michel Bickers test;Empty Can test     Hawkins-Kennedy test   Findings Positive   Side Left     Empty Can test   Findings Negative   Side Left            Objective measurements completed on examination: See above findings.           [facility] Adult PT Treatment/Exercise - 04/18/17 0001      Exercises   Exercises Shoulder     Shoulder Exercises: Standing   External Rotation Strengthening;Left;Theraband  30 reps, towel under elbow   Theraband Level (Shoulder External Rotation) Level 2 (Red)   Internal Rotation Strengthening;Left;Theraband  30 reps, towel under elbow   Theraband Level (Shoulder Internal Rotation) Level 2 (Red)   Row Strengthening;Both;Theraband  30 reps,    Theraband Level (Shoulder Row) Level 2 (Red)     Shoulder Exercises: Stretch   Other Shoulder Stretches doorway stretch arms straight to stay out of painfree motion                PT Education - 04/18/17 0745    Education provided Yes   Education Details HEP   Person(s) Educated Patient   Methods Explanation;Demonstration;Handout   Comprehension Returned demonstration;Verbalized understanding             PT Long Term Goals - 04/18/17 0750      PT LONG TERM GOAL #1   Title I with advanced HEP ( 05/16/17)    Time 4   Period Weeks   Status New     PT LONG TERM GOAL #2   Title increase Lt shoulder ROM in standing to within 10 degrees of Rt without pain ( 05/16/17)    Time 4   Period Weeks   Status New     PT LONG TERM GOAL #3   Title improve Lt shoulder strength ER and mid traps =/> 5-/5 without pain ( 05/16/17)    Time 4   Period Weeks   Status New     PT LONG TERM GOAL #4   Title maintain FOTO =/- 5 points from 14% limited ( 05/16/17)    Time 4   Period Weeks   Status New                Plan - 04/18/17 0746    Clinical Impression Statement 51 yo female presents with Lt shoulder pain/bursitis.  She has done well with the injection last week and only has pain with certain motions now.  She does have some decreased ROM and strength and tighness in her pecs.    Clinical Presentation Stable   Clinical Decision Making Low   Rehab Potential Excellent   PT Frequency 1x / week   PT  Duration 4 weeks    PT Treatment/Interventions Moist Heat;Ultrasound;Therapeutic exercise;Dry needling;Vasopneumatic Device;Manual techniques;Electrical Stimulation;Iontophoresis 4mg /ml Dexamethasone;Cryotherapy;Patient/family education;Passive range of motion   PT Next Visit Plan manual work, Lt shoulder ROM, trigger point posterior Lt shoulder. RTC strengthening and trap/rhomboid strengthening   Consulted and Agree with Plan of Care Patient      Patient will benefit from skilled therapeutic intervention in order to improve the following deficits and impairments:  Decreased range of motion, Pain, Decreased strength  Visit Diagnosis: Acute pain of left shoulder - Plan: PT plan of care cert/re-cert  Muscle weakness (generalized) - Plan: PT plan of care cert/re-cert     Problem List Patient Active Problem List   Diagnosis Date Noted  . Subacromial bursitis of left shoulder joint 11/09/2016  . Thyroid nodule 09/20/2016  . Positive ANA (antinuclear antibody) 10/13/2015  . Lumbago 10/01/2015  . Rosacea 10/01/2015  . Gluten intolerance 09/07/2011  . FATIGUE 05/26/2010  . ANEMIA, IRON DEFICIENCY NOS 07/17/2007  . ALLERGIC RHINITIS, CHRONIC 05/27/2007    Jeral Pinch PT  04/18/2017, 7:54 AM  [facility] [address] [place], [place], [address] Phone: [phone]   Fax:  [phone]  Name: Tracie Schroeder MRN: [id number] Date of Birth: Jun 23, 1966

## 2017-04-25 ENCOUNTER — Telehealth: Payer: Self-pay

## 2017-04-25 ENCOUNTER — Ambulatory Visit (INDEPENDENT_AMBULATORY_CARE_PROVIDER_SITE_OTHER): Payer: Federal, State, Local not specified - PPO | Admitting: Physical Therapy

## 2017-04-25 DIAGNOSIS — M25511 Pain in right shoulder: Secondary | ICD-10-CM | POA: Diagnosis not present

## 2017-04-25 DIAGNOSIS — M6281 Muscle weakness (generalized): Secondary | ICD-10-CM | POA: Diagnosis not present

## 2017-04-25 DIAGNOSIS — M25512 Pain in left shoulder: Secondary | ICD-10-CM | POA: Diagnosis not present

## 2017-04-25 DIAGNOSIS — M545 Low back pain, unspecified: Secondary | ICD-10-CM

## 2017-04-25 DIAGNOSIS — G8929 Other chronic pain: Secondary | ICD-10-CM

## 2017-04-25 NOTE — Telephone Encounter (Signed)
Pt left VM requesting a order for PT be placed for her back in addition to her shoulder which she is already being treated for. Please advise.

## 2017-04-25 NOTE — Therapy (Signed)
[place], [place], [address] Phone: [phone]   Fax:  [phone]  Physical Therapy Treatment  Patient Details  Name: Tracie Schroeder MRN: [id number] Date of Birth: [birth year] Referring Provider: Dr. Steva Colder  Encounter Date: 04/25/2017      PT End of Session - 04/25/17 0719    Visit Number 2   Number of Visits 4   Date for PT Re-Evaluation 05/16/17   PT Start Time 0715   PT Stop Time 0755   PT Time Calculation (min) 40 min   Activity Tolerance Patient tolerated treatment well   Behavior During Therapy [facility] for tasks assessed/performed      Past Medical History:  Diagnosis Date  . Anemia   . Arthritis   . Axillary lump    RT  . Gluten intolerance   . Thyroid disease    multinodular goiter  . Thyroid nodule     Past Surgical History:  Procedure Laterality Date  . HEMORRHOID SURGERY  12/2009  . TONSILLECTOMY    . TUBAL LIGATION    . uterine ablation      There were no vitals filed for this visit.      Subjective Assessment - 04/25/17 0720    Subjective Pt reports no new changes since last visit. She continues to struggle with Lt shoulder ER, "it just doesn't want to go that way".    Currently in Pain? No/denies            [facility] PT Assessment - 04/25/17 0001      Assessment   Medical Diagnosis Lt shoulder bursitis   Referring Provider Dr. Steva Colder   Onset Date/Surgical Date 09/18/16   Hand Dominance Right   Next MD Visit 05/10/17     AROM   Left Shoulder External Rotation 62 Degrees  supine, abdt to 90 deg.          [place] Adult PT Treatment/Exercise - 04/25/17 0001      Self-Care   Self-Care Other Self-Care Comments   Other Self-Care Comments  pt instructed in self massage with ball to Lt shoulder musculature (posterior); pt returned demo.      Shoulder Exercises: Supine   Other Supine Exercises trial of snow angel with LUE, painful at 90 deg, stopped.    Other Supine Exercises hands behind head, elbow press x 20 sec x 5 reps; Lt shoulder ER with 1# wt x 1 min, 3 reps (low load, long duration).    KTC stretch, followed by gentle LTR with opp arm in Y position x 4 reps.      Shoulder Exercises: Standing   External Rotation Strengthening;Left;10 reps;Theraband   Theraband Level (Shoulder External Rotation) Level 2 (Red)   Internal Rotation Strengthening;Left;10 reps;Theraband   Theraband Level (Shoulder Internal Rotation) Level 2 (Red)   Extension Strengthening;Left;10 reps;Theraband   Theraband Level (Shoulder Extension) Level 2 (Red)   Row Strengthening;Left;10 reps;Theraband   Theraband Level (Shoulder Row) Level 2 (Red)     Shoulder Exercises: Stretch   Other Shoulder Stretches Doorway 3 position stretch with straight elbow x 30 sec x 2 reps each position with LUE, (one rep on RUE)     Manual Therapy   Manual Therapy Soft tissue mobilization;Myofascial release;Passive ROM   Manual therapy comments pt hooklying    Soft tissue mobilization Lt pec, TPR to Lt subscap, teres maj, infraspinatus.  Edge tool assistance to Lt posterior shoulder to decrease pain and fascial adhesions.  Myofascial Release Lt pec release   Passive ROM Lt shoulder ER                     PT Long Term Goals - 04/25/17 0914      PT LONG TERM GOAL #1   Title I with advanced HEP ( 05/16/17)    Time 4   Period Weeks   Status On-going     PT LONG TERM GOAL #2   Title increase Lt shoulder ROM in standing to within 10 degrees of Rt without pain ( 05/16/17)    Time 4   Period Weeks   Status On-going     PT LONG TERM GOAL #3   Title improve Lt shoulder strength ER and mid traps =/> 5-/5 without pain ( 05/16/17)    Time 4   Period Weeks   Status On-going     PT LONG TERM GOAL #4   Title maintain FOTO =/- 5 points from 14% limited ( 05/16/17)    Time 4   Period Weeks   Status On-going               Plan - 04/25/17 0913    Clinical Impression  Statement Pt continues with limited Lt shoulder ER.  She was unable to tolerate doorway stretch with elbow bent; improved tolerance in 3 positions with elbow straight.  She reported decreased tenderness in Lt posterior shoulder after manual therapy.  Pt progressing towards goals.    Rehab Potential Excellent   PT Frequency 1x / week   PT Duration 4 weeks   PT Treatment/Interventions Moist Heat;Ultrasound;Therapeutic exercise;Dry needling;Vasopneumatic Device;Manual techniques;Electrical Stimulation;Iontophoresis 4mg /ml Dexamethasone;Cryotherapy;Patient/family education;Passive range of motion   PT Next Visit Plan manual work, Lt shoulder ROM, trigger point posterior Lt shoulder. RTC strengthening and trap/rhomboid strengthening   Consulted and Agree with Plan of Care Patient      Patient will benefit from skilled therapeutic intervention in order to improve the following deficits and impairments:  Decreased range of motion, Pain, Decreased strength  Visit Diagnosis: Acute pain of left shoulder  Muscle weakness (generalized)  Pain in joint of right shoulder     Problem List Patient Active Problem List   Diagnosis Date Noted  . Subacromial bursitis of left shoulder joint 11/09/2016  . Thyroid nodule 09/20/2016  . Positive ANA (antinuclear antibody) 10/13/2015  . Lumbago 10/01/2015  . Rosacea 10/01/2015  . Gluten intolerance 09/07/2011  . FATIGUE 05/26/2010  . ANEMIA, IRON DEFICIENCY NOS 07/17/2007  . ALLERGIC RHINITIS, CHRONIC 05/27/2007   Kerin Perna, PTA 04/25/17 9:18 AM  [place], [place], [address] Phone: [phone]   Fax:  [phone]  Name: Tracie Schroeder MRN: [id number] Date of Birth: 08-09-1966

## 2017-04-27 NOTE — Telephone Encounter (Signed)
Done

## 2017-05-02 ENCOUNTER — Ambulatory Visit (INDEPENDENT_AMBULATORY_CARE_PROVIDER_SITE_OTHER): Payer: Federal, State, Local not specified - PPO | Admitting: Physical Therapy

## 2017-05-02 DIAGNOSIS — M6281 Muscle weakness (generalized): Secondary | ICD-10-CM

## 2017-05-02 DIAGNOSIS — M25512 Pain in left shoulder: Secondary | ICD-10-CM | POA: Diagnosis not present

## 2017-05-02 NOTE — Therapy (Signed)
[place], [place], [address] Phone: [phone]   Fax:  [phone]  Physical Therapy Treatment  Patient Details  Name: Tracie Schroeder MRN: [id number] Date of Birth: 1966-02-12 Referring Provider: Dr. Georgina Snell  Encounter Date: 05/02/2017      PT End of Session - 05/02/17 0724    Visit Number 3   Number of Visits 4   Date for PT Re-Evaluation 05/16/17   PT Start Time 0718   PT Stop Time 0804   PT Time Calculation (min) 46 min   Activity Tolerance Patient tolerated treatment well   Behavior During Therapy [facility] for tasks assessed/performed      Past Medical History:  Diagnosis Date  . Anemia   . Arthritis   . Axillary lump    RT  . Gluten intolerance   . Thyroid disease    multinodular goiter  . Thyroid nodule     Past Surgical History:  Procedure Laterality Date  . HEMORRHOID SURGERY  12/2009  . TONSILLECTOMY    . TUBAL LIGATION    . uterine ablation      There were no vitals filed for this visit.      Subjective Assessment - 05/02/17 0722    Subjective Mario reports she was a little sore the remainder of day of last treatment.  No pain next day.  She feels like the motion in her Lt shoulder hasn't changed any.  She requested referral from MD to address Lt side low back pain. "When I stretch forward, it feels like my back is going to break".     Currently in Pain? Yes   Pain Score 1    Pain Location Back   Pain Orientation Lower;Left   Pain Descriptors / Indicators Dull   Aggravating Factors  ?   Pain Relieving Factors pain meds            [facility] PT Assessment - 05/02/17 0001      Assessment   Medical Diagnosis Lt shoulder bursitis   Referring Provider Dr. Georgina Snell   Onset Date/Surgical Date 09/18/16   Hand Dominance Right   Next MD Visit 05/10/17     ROM / Strength   AROM / PROM / Strength AROM;PROM     AROM   AROM Assessment Site Shoulder   Right/Left Shoulder Left   Left Shoulder External Rotation 68 Degrees  supine, abdct 90 deg     PROM   PROM Assessment Site Shoulder   Right/Left Shoulder Left   Left Shoulder External Rotation 74 Degrees     Strength   Left Shoulder Internal Rotation --  5-/5   Left Shoulder External Rotation 4+/5            [facility] Adult PT Treatment/Exercise - 05/02/17 0001      Shoulder Exercises: Supine   Horizontal ABduction Strengthening;Both;10 reps;Theraband   Theraband Level (Shoulder Horizontal ABduction) Level 3 (Green)   External Rotation AAROM;Left;10 reps  10 sec hold.    Flexion Strengthening;Both;10 reps;Theraband   Theraband Level (Shoulder Flexion) Level 3 (Green)   Other Supine Exercises trial of snow angel with LUE, painful at 128 deg, stopped after 2 reps.  D2 flexion (sash) with green band x 10 reps each arm, 2nd set on LUE.     Other Supine Exercises hands behind head, elbow press x 45 sec x 3 reps; Lt shoulder ER with 2# wt x 1 min,  (low load, long duration)  Shoulder Exercises: Sidelying   External Rotation Strengthening;Left;10 reps;Weights  2 sets   External Rotation Weight (lbs) 2  4 reps with 3#, too heavy.      Shoulder Exercises: Stretch   Other Shoulder Stretches Doorway 3 position stretch with straight elbow x 30 sec x 2 reps each position with LUE; one rep midlevel doorway position with elbow bent (improved tolerance)     Manual Therapy   Soft tissue mobilization  Edge tool assistance to Lt pec, prox bicep, posterior shoulder to decrease pain and fascial adhesions.    Passive ROM Lt shoulder ER                PT Education - 05/02/17 0825    Education provided Yes   Education Details HEP - issued green band   Person(s) Educated Patient   Methods Explanation;Handout;Demonstration;Tactile cues   Comprehension Verbalized understanding;Returned demonstration             PT Long Term Goals - 05/02/17 0086      PT LONG TERM GOAL #1   Title I with advanced HEP (  05/16/17)    Time 4   Period Weeks   Status On-going     PT LONG TERM GOAL #2   Title increase Lt shoulder ROM in standing to within 10 degrees of Rt without pain ( 05/16/17)    Time 4   Period Weeks   Status On-going     PT LONG TERM GOAL #3   Title improve Lt shoulder strength ER and mid traps =/> 5-/5 without pain ( 05/16/17)    Time 4   Period Weeks   Status On-going     PT LONG TERM GOAL #4   Title maintain FOTO =/- 5 points from 14% limited ( 05/16/17)    Time 4   Period Weeks   Status On-going     PT LONG TERM GOAL #5   Title -               Plan - 05/02/17 0808    Clinical Impression Statement Nydia's Lt shoulder ER ROM has improved since last visit, as well as her strength.  She was able to tolerate doorway stretch with elbow bent today.  Less palpable tightness in posterior Lt shoulder with manual therapy.  Pt making good progress towards established goals.    Rehab Potential Excellent   PT Frequency 1x / week   PT Duration 4 weeks   PT Treatment/Interventions Moist Heat;Ultrasound;Therapeutic exercise;Dry needling;Vasopneumatic Device;Manual techniques;Electrical Stimulation;Iontophoresis 4mg /ml Dexamethasone;Cryotherapy;Patient/family education;Passive range of motion   PT Next Visit Plan Evaluate back per new order; continue progressive Lt shoulder ROM/strengthening.    Consulted and Agree with Plan of Care Patient      Patient will benefit from skilled therapeutic intervention in order to improve the following deficits and impairments:  Decreased range of motion, Pain, Decreased strength  Visit Diagnosis: Acute pain of left shoulder  Muscle weakness (generalized)     Problem List Patient Active Problem List   Diagnosis Date Noted  . Subacromial bursitis of left shoulder joint 11/09/2016  . Thyroid nodule 09/20/2016  . Positive ANA (antinuclear antibody) 10/13/2015  . Lumbago 10/01/2015  . Rosacea 10/01/2015  . Gluten intolerance 09/07/2011  .  FATIGUE 05/26/2010  . ANEMIA, IRON DEFICIENCY NOS 07/17/2007  . ALLERGIC RHINITIS, CHRONIC 05/27/2007   Kerin Perna, PTA 05/02/17 8:33 AM  [facility] [place], [place], [address] Phone: [phone]  Fax:  [phone]  Name: Rashanda Magloire MRN: [id number] Date of Birth: 1965/12/20

## 2017-05-02 NOTE — Patient Instructions (Addendum)
*   ball massage to shoulder muscles ~5 min / day.   Internal Rotation (Eccentric), Active-Assist - Supine (Cane)    Lie on back, affected arm out from side, elbow at 90, forearm raised. Slowly lower arm for 3-5 seconds to table. Use cane to help return forearm to upright position.   Sash    On back, knees bent, feet flat, left hand on left hip, right hand above left. Pull right arm DIAGONALLY (hip to shoulder) across chest. Bring right arm along head toward floor. Hold momentarily. Slowly return to starting position. Repeat _10__ times, 2 sets. Do with left arm. Band color __green____   Over Head Pull: Narrow Grip        On back, knees bent, feet flat, band across thighs, elbows straight but relaxed. Pull hands apart (start). Keeping elbows straight, bring arms up and over head, hands toward floor. Keep pull steady on band. Hold momentarily. Return slowly, keeping pull steady, back to start. Repeat _15__ times. Band color __green____   Side Pull: Double Arm   On back, knees bent, feet flat. Arms perpendicular to body, shoulder level, elbows straight but relaxed. Pull arms out to sides, elbows straight. Resistance band comes across collarbones, hands toward floor. Hold momentarily. Slowly return to starting position. Repeat __15_ times. Band color __green___    Progressive Resisted: External Rotation (Side-Lying)     Holding __2__ pound weight, towel under arm, raise right forearm toward ceiling. Keep elbow bent and at side. Repeat __10__ times per set. Do _3___ sets per session. Do __1__ sessions per day.  [facility] [place], [place] [address]  [phone] (office) [phone] (fax)

## 2017-05-09 ENCOUNTER — Encounter: Payer: Self-pay | Admitting: Physical Therapy

## 2017-05-09 ENCOUNTER — Ambulatory Visit (INDEPENDENT_AMBULATORY_CARE_PROVIDER_SITE_OTHER): Payer: Federal, State, Local not specified - PPO | Admitting: Physical Therapy

## 2017-05-09 DIAGNOSIS — M6289 Other specified disorders of muscle: Secondary | ICD-10-CM

## 2017-05-09 DIAGNOSIS — M25512 Pain in left shoulder: Secondary | ICD-10-CM | POA: Diagnosis not present

## 2017-05-09 DIAGNOSIS — M6281 Muscle weakness (generalized): Secondary | ICD-10-CM | POA: Diagnosis not present

## 2017-05-09 DIAGNOSIS — M545 Low back pain, unspecified: Secondary | ICD-10-CM

## 2017-05-09 DIAGNOSIS — M629 Disorder of muscle, unspecified: Secondary | ICD-10-CM | POA: Diagnosis not present

## 2017-05-09 DIAGNOSIS — M25511 Pain in right shoulder: Secondary | ICD-10-CM

## 2017-05-09 DIAGNOSIS — R293 Abnormal posture: Secondary | ICD-10-CM

## 2017-05-09 DIAGNOSIS — G8929 Other chronic pain: Secondary | ICD-10-CM

## 2017-05-09 NOTE — Therapy (Signed)
[place], [place], [address] Phone: [phone]   Fax:  [phone]  Physical Therapy Treatment  Patient Details  Name: Tracie Schroeder MRN: [id number] Date of Birth: 03/05/1966 Referring Provider: Dr Steva Colder  Encounter Date: 05/09/2017      PT End of Session - 05/09/17 0715    Visit Number 4   Number of Visits 10   Date for PT Re-Evaluation 06/20/17   PT Start Time 0715   PT Stop Time 0756   PT Time Calculation (min) 41 min   Activity Tolerance Patient tolerated treatment well   Behavior During Therapy [facility] for tasks assessed/performed      Past Medical History:  Diagnosis Date  . Anemia   . Arthritis   . Axillary lump    RT  . Gluten intolerance   . Thyroid disease    multinodular goiter  . Thyroid nodule     Past Surgical History:  Procedure Laterality Date  . HEMORRHOID SURGERY  12/2009  . TONSILLECTOMY    . TUBAL LIGATION    . uterine ablation      There were no vitals filed for this visit.      Subjective Assessment - 05/09/17 0717    Subjective Tracie Schroeder reports her shoulder is about 50% improved. She is here today to have her back assessed and added to her plan of care.  She has had back pain for about 2 years and she figured she is coming to PT and wants the back addressed also.  Before she started walking more she had  a lot of pain with transitioing sit to stand.  She reports she had pain in her back everyday it varies.    How long can you sit comfortably? 45 min   How long can you walk comfortably? makes her back pain better   Diagnostic tests x-ray - arthritis   Patient Stated Goals full ROM of Lt shoulder and eliminate the pain, get rid of back pain.    Currently in Pain? No/denies  no shoulder or back pain this AM            [facility] PT Assessment - 05/09/17 0001      Assessment   Medical Diagnosis Lt shoulder bursitis & lt sided LBP   Referring Provider Dr Steva Colder   Onset Date/Surgical Date 09/18/16   Hand Dominance Right   Next MD Visit 05/10/17     ROM / Strength   AROM / PROM / Strength AROM;Strength     AROM   AROM Assessment Site Lumbar;Shoulder   Right/Left Shoulder Left   Left Shoulder Internal Rotation 22 Degrees  in standing, shoulder abduct 90, with pain   Left Shoulder External Rotation 70 Degrees  in standing shoulder abduct 90 degrees supine 70   Lumbar Flexion to ankles, pain in Lt sided low back   Lumbar Extension WNL - pain Lt low back    Lumbar - Right Side Bend 25% present - pain Lt back   Lumbar - Left Side Bend 75% present pain Lt back   Lumbar - Right Rotation WNL   Lumbar - Left Rotation WNL     PROM   PROM Assessment Site Shoulder   Right/Left Shoulder Left   Left Shoulder External Rotation 83 Degrees     Strength   Overall Strength Comments mid traps Lt 5-/5, rt 5/5, pain Lt side   Strength Assessment Site Shoulder;Hip;Knee;Ankle;Lumbar   Right/Left Shoulder  Left   Left Shoulder Internal Rotation --  5-/5   Left Shoulder External Rotation 4+/5   Right/Left Hip --  bilat grossly 5-/5   Right/Left Knee --  Rt WNL, Lt grossly 5-/5   Right/Left Ankle --  bilat WNL   Lumbar Flexion --  transverse abdom fair.    Lumbar Extension --  multifidi strong, however not able to lift upper body far.      Flexibility   Soft Tissue Assessment /Muscle Length yes   Hamstrings supine SLR Lt 68 degrees, Rt 82   Quadriceps WNL   Piriformis tight Lt side     Palpation   Palpation comment some tightness in Lt upper gluts and lumbar paraspinals - no tenderness                     [facility] Adult PT Treatment/Exercise - 05/09/17 0001      Exercises   Exercises Shoulder;Lumbar     Lumbar Exercises: Stretches   Passive Hamstring Stretch 1 rep;30 seconds  SLR with strap   Single Knee to Chest Stretch 1 rep;30 seconds  each side   ITB Stretch 1 rep;30 seconds  cross body with strap     Lumbar  Exercises: Supine   Bridge 10 reps  with UE horizontal abduct   Other Supine Lumbar Exercises pilates table top with shoulder ER                 PT Education - 05/09/17 0810    Education provided Yes   Education Details HEP to add in lumbar ex   Person(s) Educated Patient   Methods Explanation;Demonstration;Handout   Comprehension Returned demonstration;Verbalized understanding             PT Long Term Goals - 05/09/17 0814      PT LONG TERM GOAL #1   Title I with advanced HEP ( 06/20/17)    Time 10   Period Weeks   Status On-going     PT LONG TERM GOAL #2   Title increase Lt shoulder ROM in standing to within 10 degrees of Rt without pain ( 06/20/17)    Time 10   Period Weeks   Status On-going     PT LONG TERM GOAL #3   Title improve Lt shoulder strength ER and mid traps =/> 5-/5 without pain ( 05/16/17)    Status Achieved     PT LONG TERM GOAL #4   Title maintain FOTO =/- 5 points from 14% limited ( 06/20/17)    Time 10   Period Weeks   Status On-going     PT LONG TERM GOAL #5   Title improve Lt hamstring flexibility =/> 80 degrees ( 06/20/17)    Time 6   Status New     PT LONG TERM GOAL #6   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  Sometimes she will have pain during the day.  She has tightness around the Lt hip complex and weakness inher core.  Her Lt shoulder is improving slowly with increased strength and ROM.  We will continiue to work on this as well.  Progress is slow as she is only available for early or late  appointments and can only come once a week.    Rehab Potential Excellent   PT Frequency 1x / week   PT Duration 6 weeks   PT Treatment/Interventions Moist Heat;Ultrasound;Therapeutic exercise;Dry needling;Vasopneumatic Device;Manual techniques;Electrical Stimulation;Iontophoresis 4mg /ml Dexamethasone;Cryotherapy;Patient/family education;Passive range of motion   PT Next Visit Plan assess LE flexibility progress LE, and core,possible Manual work to [employer] continue in shoulder.  She may not be back for two weeks as there were not appointment times open when she is available .    Consulted and Agree with Plan of Care Patient      Patient will benefit from skilled therapeutic intervention in order to improve the following deficits and impairments:  Decreased range of motion, Pain, Decreased strength, Impaired UE functional use, Impaired flexibility  Visit Diagnosis: Acute pain of left shoulder - Plan: PT plan of care cert/re-cert  Muscle weakness (generalized) - Plan: PT plan of care cert/re-cert  Pain in joint of right shoulder - Plan: PT plan of care cert/re-cert  Muscular imbalance - Plan: PT plan of care cert/re-cert  Abnormal posture - Plan: PT plan of care cert/re-cert  Chronic left-sided low back pain without sciatica - Plan: PT plan of care cert/re-cert     Problem List Patient Active Problem List   Diagnosis Date Noted  . Subacromial bursitis of left shoulder joint 11/09/2016  . Thyroid nodule 09/20/2016  . Positive ANA (antinuclear antibody) 10/13/2015  . Lumbago 10/01/2015  . Rosacea 10/01/2015  . Gluten intolerance 09/07/2011  . FATIGUE 05/26/2010  . ANEMIA, IRON DEFICIENCY NOS 07/17/2007  . ALLERGIC RHINITIS, CHRONIC 05/27/2007    Jeral Pinch PT  05/09/2017, 8:20 [place], [place], [address] Phone: [phone]   Fax:  [phone]  Name: Tracie Schroeder MRN:  [id number] Date of Birth: 1966/01/07

## 2017-05-09 NOTE — Patient Instructions (Addendum)
Bridging - do this one with the exercise below.     Slowly raise buttocks from floor, keeping stomach tight. Repeat _3x10___ times per set. Do __1__ sets per session. Do __1__ sessions per day.     On back, knees bent, feet flat. Arms perpendicular to body, shoulder level, elbows straight but relaxed. Pull arms out to sides, elbows straight. Resistance band comes across collarbones, hands toward floor. Hold momentarily. Slowly return to starting position. Repeat _3x10__ times. Band color __green___. Once a day.   Shoulder Rotation: Double Arm - do this with the picture below ( legs up only)     On back, knees bent, feet flat, elbows tucked at sides, bent 90, hands palms up. Pull hands apart and down toward floor, keeping elbows near sides. Hold momentarily. Slowly return to starting position. Repeat 3x10___ times. Band color __green____ . Once a day.    Lie on back, legs up, bent,  Knee-to-Chest Stretch: Unilateral    With hand behind right knee, pull knee in to chest until a comfortable stretch is felt in lower back and buttocks. Keep back relaxed. Hold _30-45___ seconds. Repeat __1-2__ times per set. Do __1__ sets per session. Do ___1_ sessions per day.  Supine: Leg Stretch with Strap (Super Advanced)    Lie on back with one leg straight. Hook strap around other foot. Straighten knee. Raise leg to maximal stretch and straighten knee further by tightening quadriceps. Slowly press other leg down as close to floor as possible. Keep lower abdominals tight. Hold _30-45__ seconds. Warning: Intense stretch. Stay within tolerance. Repeat _1-2__ times per session. Do _1__ sessions per day.   Outer Hip Stretch: Reclined IT Band Stretch (Strap)    Strap around opposite foot, pull across only as far as possible with shoulders on mat. Hold for _30-45__secs. Repeat _1-2___ times each leg. Once a day  Copyright  VHI. All rights reserved.

## 2017-05-10 ENCOUNTER — Encounter: Payer: Self-pay | Admitting: Family Medicine

## 2017-05-10 ENCOUNTER — Ambulatory Visit (INDEPENDENT_AMBULATORY_CARE_PROVIDER_SITE_OTHER): Payer: Federal, State, Local not specified - PPO | Admitting: Family Medicine

## 2017-05-10 VITALS — BP 147/86 | HR 68 | Ht 64.0 in | Wt 144.0 lb

## 2017-05-10 DIAGNOSIS — G8929 Other chronic pain: Secondary | ICD-10-CM | POA: Diagnosis not present

## 2017-05-10 DIAGNOSIS — M545 Low back pain, unspecified: Secondary | ICD-10-CM

## 2017-05-10 DIAGNOSIS — M7502 Adhesive capsulitis of left shoulder: Secondary | ICD-10-CM | POA: Diagnosis not present

## 2017-05-10 DIAGNOSIS — M7062 Trochanteric bursitis, left hip: Secondary | ICD-10-CM | POA: Diagnosis not present

## 2017-05-10 NOTE — Progress Notes (Signed)
Tracie Schroeder is a 51 y.o. female who presents to [facility] today for follow-up left shoulder pain and discuss left low back and left lateral hip pain.  Left shoulder pain: Patient has been seen several times for left shoulder pain. She had incomplete resolution of pain following a subacromial injection. I was suspicious for adhesive capsulitis. In the interim she's had physical therapy and notes significant improvement in symptoms and range of motion. She has continued pain when her arm is in the abducted and externally rotated position.  Patient would also like to discuss her back pain. She notes chronic left low back pain. This is been ongoing now for years. She had a back x-ray in 2016 that did show some degeneration at L4-L5 facet joints. She has the pain is present with prolonged sitting. She does have some pain in the lateral hip radiating down to the left lateral thigh. She denies any pain radiating below the level of the knee. She denies any weakness or numbness bowel bladder dysfunction.   Past Medical History:  Diagnosis Date  . Anemia   . Arthritis   . Axillary lump    RT  . Gluten intolerance   . Thyroid disease    multinodular goiter  . Thyroid nodule    Past Surgical History:  Procedure Laterality Date  . HEMORRHOID SURGERY  12/2009  . TONSILLECTOMY    . TUBAL LIGATION    . uterine ablation     Social History  Substance Use Topics  . Smoking status: Former Smoker    Quit date: 11/14/1991  . Smokeless tobacco: Never Used  . Alcohol use 1.0 oz/week    2 Standard drinks or equivalent per week     Comment: per week     ROS:  As above   Medications: Current Outpatient Prescriptions  Medication Sig Dispense Refill  . azelaic acid (AZELEX) 20 % cream Apply topically 2 (two) times daily. After skin is thoroughly washed and patted dry, gently but thoroughly massage a thin film of azelaic acid cream into the affected area  twice daily, in the morning and evening. 30 g 2  . cyclobenzaprine (FLEXERIL) 10 MG tablet Take 0.5-1 tablets (5-10 mg total) by mouth at bedtime as needed for muscle spasms. 30 tablet 2  . diclofenac sodium (VOLTAREN) 1 % GEL Apply 2 g topically 4 (four) times daily. To affected joint. 400 g 11  . EPINEPHrine 0.3 mg/0.3 mL IJ SOAJ injection Inject 0.3 mLs (0.3 mg total) into the muscle once. 1 Device 1  . Hyaluronic Acid-Vitamin C (HYALURONIC ACID PO) Take 166 mg by mouth 2 (two) times daily.    Marland Kitchen loratadine (CLARITIN) 10 MG tablet Take 10 mg by mouth daily.    . meloxicam (MOBIC) 15 MG tablet Take 15 mg by mouth as needed for pain.    . Methylcobalamin 2500 MCG SUBL Place 1 tablet under the tongue once a week.    . pantoprazole (PROTONIX) 40 MG tablet TAKE 1 TABLET BY MOUTH EVERY DAY 90 tablet 2  . traMADol (ULTRAM) 50 MG tablet Take 1 tablet (50 mg total) by mouth every 8 (eight) hours as needed. 45 tablet 0  . Ubiquinol 100 MG CAPS Take 1 capsule by mouth daily.     No current facility-administered medications for this visit.    Allergies  Allergen Reactions  . Adhesive [Tape] Itching  . Biaxin [Clarithromycin]   . Other     Almonds and hazelnuts  Exam:  BP (!) 147/86   Pulse 68   Ht 5\' 4"  (1.626 m)   Wt 144 lb (65.3 kg)   BMI 24.72 kg/m  General: Well Developed, well nourished, and in no acute distress.  Neuro/Psych: Alert and oriented x3, extra-ocular muscles intact, able to move all 4 extremities, sensation grossly intact. Skin: Warm and dry, no rashes noted.  Respiratory: Not using accessory muscles, speaking in full sentences, trachea midline.  Cardiovascular: Pulses palpable, no extremity edema. Abdomen: Does not appear distended. MSK:  Left shoulder normal-appearing nontender. Normal internal rotation and abduction. External rotation lacks 10 compared to contralateral right side. Drinks is intact.  L-spine nontender to spinal midline. Normal back  motion.  Left hip: Normal appearing normal motion. Mildly tender to greater trochanter. Hip abduction strength is slightly diminished 4+/5  Study Result   CLINICAL DATA:  Chronic left low back pain without radiation to the leg. Several months duration.  EXAM: LUMBAR SPINE - COMPLETE 4+ VIEW  COMPARISON:  None.  FINDINGS: Five lumbar type vertebral bodies. Disc space heights are within normal limits. No evidence of pars defect or advanced facet arthropathy. Mild facet degeneration at L4-5. No fracture or focal lesion. Sacroiliac joints appear normal.  IMPRESSION: Normal except for mild facet degeneration at L4-5.   Electronically Signed   By: Nelson Chimes M.D.   On: 10/01/2015 11:37     No results found for this or any previous visit (from the past 48 hour(s)). No results found.    Assessment and Plan: 51 y.o. female with  Left shoulder pain likely adhesive capsulitis improving. Plan to continue physical therapy and recheck in 6 weeks .  Back pain: Likely myofascial dysfunction in the setting of facet DJD. Plan to start physical therapy for this issue. We discussed facet injections and we'll recheck this issue in about 6 weeks.  Lateral hip pain on the left: Likely hip abductor tendinopathy. Again we'll ask physical therapy to work on this issue and recheck in 6 weeks.   No orders of the defined types were placed in this encounter.  No orders of the defined types were placed in this encounter.   Discussed warning signs or symptoms. Please see discharge instructions. Patient expresses understanding.

## 2017-05-10 NOTE — Patient Instructions (Signed)
Thank you for coming in today. Continue PT for both the shoulder and back.  I will ask the therapists to include some work on the hip abductors.  Recheck in 6 weeks or so.   Return sooner if needed.

## 2017-05-22 IMAGING — CR DG SHOULDER 2+V*R*
3 series · 3 of 3 positions shown · non-contrast
Comparison: None.

CLINICAL DATA: Right shoulder pain today. No known injury. Initial
encounter.

EXAM:
RIGHT SHOULDER - 2+ VIEW

[shoulder grashey]
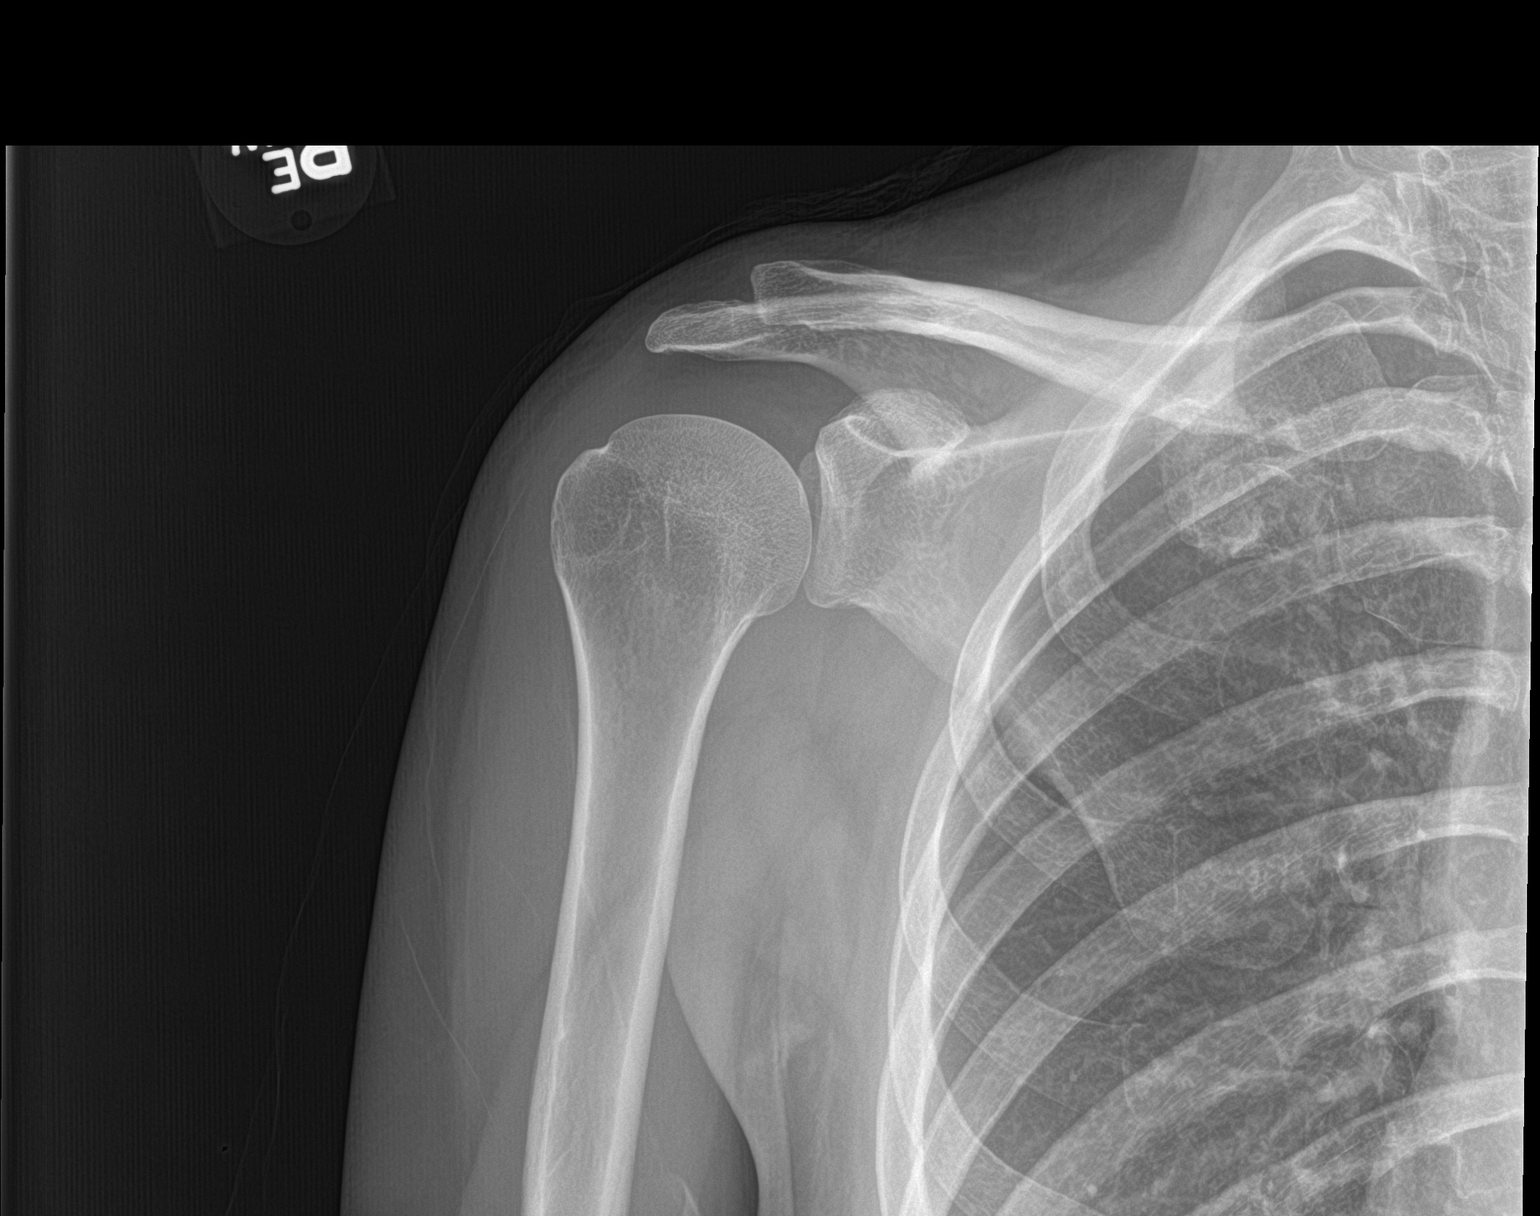

[shoulder y view]
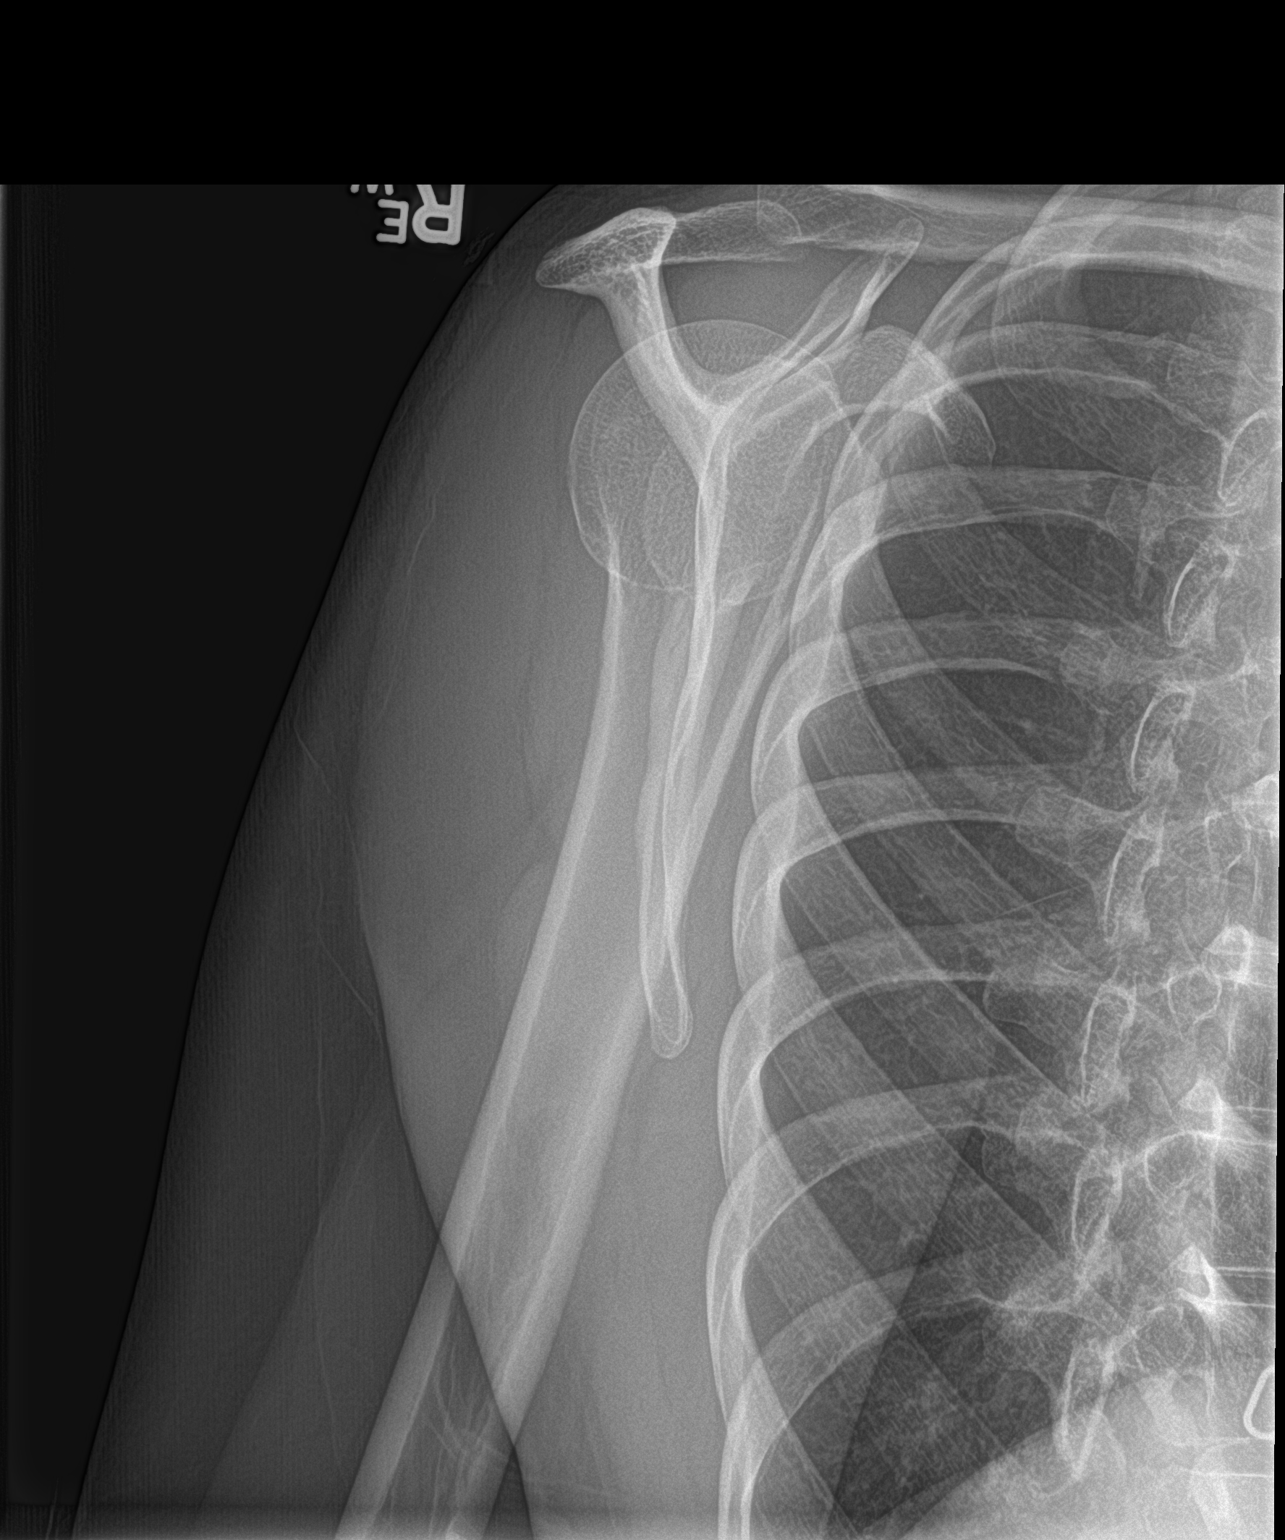

[shoulder axillary]
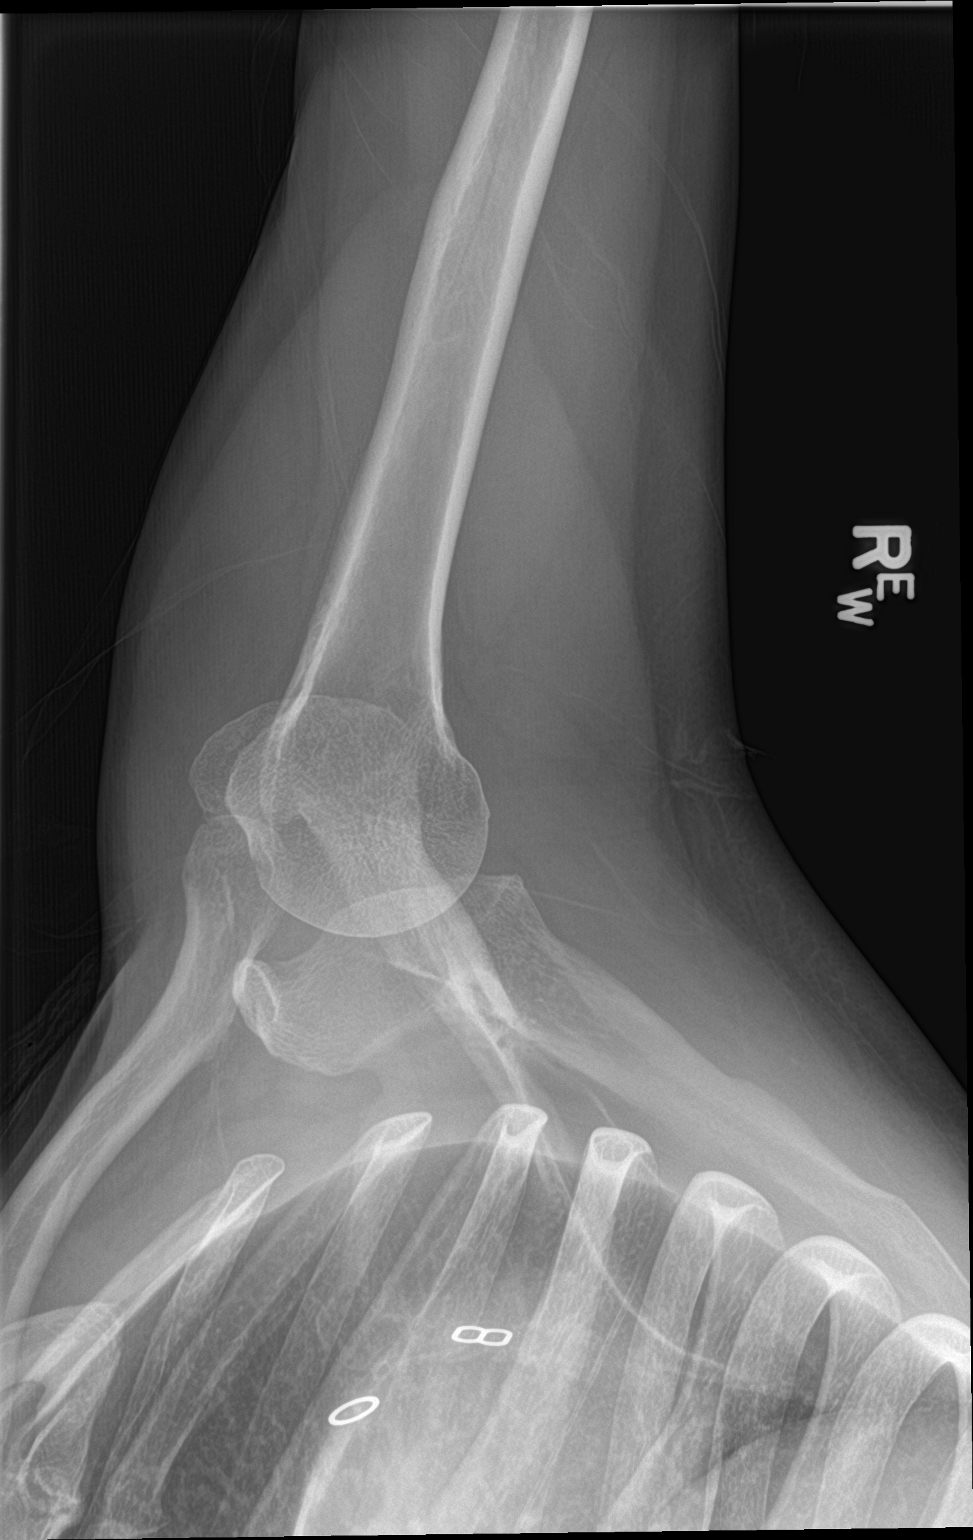

[3 of 3 positions shown; findings below may reference images not displayed]

FINDINGS: There is no evidence of fracture or dislocation. There is no
evidence of arthropathy or other focal bone abnormality. Soft
tissues are unremarkable.
IMPRESSION: Negative exam.

## 2017-05-23 ENCOUNTER — Encounter: Payer: Federal, State, Local not specified - PPO | Admitting: Physical Therapy

## 2017-05-30 ENCOUNTER — Ambulatory Visit (INDEPENDENT_AMBULATORY_CARE_PROVIDER_SITE_OTHER): Payer: Federal, State, Local not specified - PPO | Admitting: Physical Therapy

## 2017-05-30 DIAGNOSIS — M6281 Muscle weakness (generalized): Secondary | ICD-10-CM

## 2017-05-30 DIAGNOSIS — M629 Disorder of muscle, unspecified: Secondary | ICD-10-CM | POA: Diagnosis not present

## 2017-05-30 DIAGNOSIS — M25512 Pain in left shoulder: Secondary | ICD-10-CM

## 2017-05-30 DIAGNOSIS — M6289 Other specified disorders of muscle: Secondary | ICD-10-CM

## 2017-05-30 DIAGNOSIS — R293 Abnormal posture: Secondary | ICD-10-CM | POA: Diagnosis not present

## 2017-05-30 DIAGNOSIS — M25511 Pain in right shoulder: Secondary | ICD-10-CM

## 2017-05-30 NOTE — Therapy (Signed)
[place], [place], [address] Phone: [phone]   Fax:  [phone]  Physical Therapy Treatment  Patient Details  Name: Tracie Schroeder MRN: [id number] Date of Birth: Aug 24, 1966 Referring Provider: Dr. Georgina Snell   Encounter Date: 05/30/2017      PT End of Session - 05/30/17 0720    Visit Number 5   Number of Visits 10   Date for PT Re-Evaluation 06/20/17   PT Start Time 0719   PT Stop Time 0805   PT Time Calculation (min) 46 min   Activity Tolerance Patient tolerated treatment well;No increased pain   Behavior During Therapy WFL for tasks assessed/performed      Past Medical History:  Diagnosis Date  . Anemia   . Arthritis   . Axillary lump    RT  . Gluten intolerance   . Thyroid disease    multinodular goiter  . Thyroid nodule     Past Surgical History:  Procedure Laterality Date  . HEMORRHOID SURGERY  12/2009  . TONSILLECTOMY    . TUBAL LIGATION    . uterine ablation      There were no vitals filed for this visit.      Subjective Assessment - 05/30/17 0721    Subjective Pt reports her Rt shoulder has been beginning to hurt again.  She has been sleeping on her Rt side more lately. She is walking 2.5 miles every day, and performing arm HEP.  She is wanting to get her Lt shoulder resolved.     Currently in Pain? Yes   Pain Score 3    Pain Location Shoulder   Pain Orientation Right;Anterior   Pain Descriptors / Indicators Tender   Aggravating Factors  moving arm.    Pain Relieving Factors pain medication             [facility] PT Assessment - 05/30/17 0001      Assessment   Medical Diagnosis Lt shoulder bursitis & lt sided LBP   Referring Provider Dr. Georgina Snell    Onset Date/Surgical Date 09/18/16   Hand Dominance Right   Next MD Visit 06/21/17     AROM   Left Shoulder External Rotation 70 Degrees  shoulder abduct 90 degrees in supine     PROM   Right/Left Shoulder Left    Left Shoulder External Rotation 90 Degrees     Flexibility   Hamstrings supine SLR Lt 68 degrees, Rt 86          [facility] Adult PT Treatment/Exercise - 05/30/17 0001      Lumbar Exercises: Stretches   Passive Hamstring Stretch 30 seconds;3 reps  SLR with strap   Single Knee to Chest Stretch 20 seconds;2 reps  each side     Lumbar Exercises: Aerobic   Stationary Bike NuStep L5: (arms/legs) x 5 min      Shoulder Exercises: Stretch   Other Shoulder Stretches Midlevel doorway stretch x 30 sec x 2 reps, upper height doorway stretch unilateral x 20 sec each arm.      Modalities   Modalities Electrical Stimulation;Moist Heat  limited time due to pt having to leave for work     Moist Heat Therapy   Number Minutes Moist Heat 8 Minutes   Moist Heat Location Lumbar Spine;Shoulder  Rt shoulder     Electrical Stimulation   Electrical Stimulation Location Lt/Rt Freight forwarder Action premod to each area   Electrical Stimulation Parameters to tolerance  Electrical Stimulation Goals Pain     Manual Therapy   Manual Therapy Soft tissue mobilization   Soft tissue mobilization  Edge tool assistance to Lt low back paraspinals/QL to decrease fascial restriction; STM to Rt pec, prox bicep to decrease pain and fascial adhesions.    Myofascial Release Rt pec release.      Ankle Exercises: Stretches   Gastroc Stretch 2 reps;20 seconds                PT Education - 05/30/17 1228    Education provided Yes   Education Details HEP- seated hamstring and piriformis stretch, posture awareness.    Person(s) Educated Patient   Methods Explanation;Demonstration;Verbal cues   Comprehension Returned demonstration;Verbalized understanding             PT Long Term Goals - 05/09/17 4782      PT LONG TERM GOAL #1   Title I with advanced HEP ( 06/20/17)    Time 10   Period Weeks   Status On-going     PT LONG TERM GOAL #2   Title increase Lt shoulder ROM in  standing to within 10 degrees of Rt without pain ( 06/20/17)    Time 10   Period Weeks   Status On-going     PT LONG TERM GOAL #3   Title improve Lt shoulder strength ER and mid traps =/> 5-/5 without pain ( 05/16/17)    Status Achieved     PT LONG TERM GOAL #4   Title maintain FOTO =/- 5 points from 14% limited ( 06/20/17)    Time 10   Period Weeks   Status On-going     PT LONG TERM GOAL #5   Title improve Lt hamstring flexibility =/> 80 degrees ( 06/20/17)    Time 6   Status New     PT LONG TERM GOAL #6   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  Pain in Rt shoulder has reappeared.  Pt continues with tight hamstrings; added stretch to HEP.  Pt making gradual progress towards established goals.    Rehab Potential Excellent   PT Frequency 1x / week   PT Duration 6 weeks   PT Treatment/Interventions Moist Heat;Ultrasound;Therapeutic exercise;Dry needling;Vasopneumatic Device;Manual techniques;Electrical Stimulation;Iontophoresis 4mg /ml Dexamethasone;Cryotherapy;Patient/family education;Passive range of motion   PT Next Visit Plan Continue postural strengthening, shoulder ROM, LE flexibility.    Consulted and Agree with Plan of Care Patient      Patient will benefit from skilled therapeutic intervention in order to improve the following deficits and impairments:  Decreased range of motion, Pain, Decreased strength, Impaired UE functional use, Impaired flexibility  Visit Diagnosis: Acute pain of left shoulder  Muscle weakness (generalized)  Pain in joint of right shoulder  Muscular imbalance  Abnormal  posture     Problem List Patient Active Problem List   Diagnosis Date Noted  . Adhesive capsulitis of left shoulder 05/10/2017  . Trochanteric bursitis of left hip 05/10/2017  .  Thyroid nodule 09/20/2016  . Positive ANA (antinuclear antibody) 10/13/2015  . Lumbago 10/01/2015  . Rosacea 10/01/2015  . Gluten intolerance 09/07/2011  . FATIGUE 05/26/2010  . ANEMIA, IRON DEFICIENCY NOS 07/17/2007  . ALLERGIC RHINITIS, CHRONIC 05/27/2007   Kerin Perna, PTA 05/30/17 12:34 PM  [place], [place], [address] Phone: [phone]   Fax:  [phone]  Name: Tracie Schroeder MRN: [id number] Date of Birth: [birth year]

## 2017-06-06 ENCOUNTER — Ambulatory Visit (INDEPENDENT_AMBULATORY_CARE_PROVIDER_SITE_OTHER): Payer: Federal, State, Local not specified - PPO | Admitting: Physical Therapy

## 2017-06-06 ENCOUNTER — Encounter: Payer: Self-pay | Admitting: Physical Therapy

## 2017-06-06 DIAGNOSIS — R531 Weakness: Secondary | ICD-10-CM | POA: Diagnosis not present

## 2017-06-06 DIAGNOSIS — M545 Low back pain, unspecified: Secondary | ICD-10-CM

## 2017-06-06 DIAGNOSIS — R293 Abnormal posture: Secondary | ICD-10-CM | POA: Diagnosis not present

## 2017-06-06 DIAGNOSIS — Z7409 Other reduced mobility: Secondary | ICD-10-CM

## 2017-06-06 DIAGNOSIS — G8929 Other chronic pain: Secondary | ICD-10-CM

## 2017-06-06 DIAGNOSIS — M25511 Pain in right shoulder: Secondary | ICD-10-CM

## 2017-06-06 DIAGNOSIS — R6889 Other general symptoms and signs: Secondary | ICD-10-CM

## 2017-06-06 DIAGNOSIS — M25512 Pain in left shoulder: Secondary | ICD-10-CM

## 2017-06-06 DIAGNOSIS — M6281 Muscle weakness (generalized): Secondary | ICD-10-CM | POA: Diagnosis not present

## 2017-06-06 NOTE — Therapy (Signed)
[place], [place], [address] Phone: [phone]   Fax:  [phone]  Physical Therapy Treatment  Patient Details  Name: Tracie Schroeder MRN: [id number] Date of Birth: 14-Mar-1966 Referring Provider: Dr Georgina Snell  Encounter Date: 06/06/2017      PT End of Session - 06/06/17 0718    Visit Number 6   Number of Visits 10   Date for PT Re-Evaluation 06/20/17   PT Start Time 0718   PT Stop Time 0812   PT Time Calculation (min) 54 min   Activity Tolerance Patient tolerated treatment well      Past Medical History:  Diagnosis Date  . Anemia   . Arthritis   . Axillary lump    RT  . Gluten intolerance   . Thyroid disease    multinodular goiter  . Thyroid nodule     Past Surgical History:  Procedure Laterality Date  . HEMORRHOID SURGERY  12/2009  . TONSILLECTOMY    . TUBAL LIGATION    . uterine ablation      There were no vitals filed for this visit.      Subjective Assessment - 06/06/17 0720    Subjective Pt reports she is feeling a lot better, Rt side shoulder pain gone, Lt side a lot better, 90% improved.  Low back better when she does the exercise.    Patient Stated Goals full ROM of Lt shoulder and eliminate the pain, get rid of back pain.    Currently in Pain? No/denies  back was sore when she first woke up today, went away once she started moving around            [facility] PT Assessment - 06/06/17 0001      Assessment   Medical Diagnosis Lt shoulder bursitis & lt sided LBP   Referring Provider Dr Georgina Snell   Onset Date/Surgical Date 09/18/16   Hand Dominance Right   Next MD Visit 06/21/17     Flexibility   Hamstrings supine SLR Rt 90, Lt 88                     [facility] Adult PT Treatment/Exercise - 06/06/17 0001      Lumbar Exercises: Stretches   Passive Hamstring Stretch 2 reps;30 seconds  with strap each side   ITB Stretch 1 rep;30 seconds  cross body with strap     Lumbar Exercises: Aerobic   UBE (Upper Arm Bike) standing on upside down bosu, L3 x4' alt FWD/BWD     Lumbar Exercises: Supine   Bridge 5 reps;5 seconds  with feet on physioball   Bridge Limitations 4x3 reps HS curls with feet on ball.   2x8 single leg bridges each side.      Shoulder Exercises: Supine   Other Supine Exercises 10x10sec, hands behind head, scapular squeezes   Other Supine Exercises 3x10 PPT with overhead reach holding 4# each hand/lat work     Shoulder Exercises: Standing   Row Strengthening;Both;20 reps;Weights  in FWD lean, straight back, core engaged   Row Weight (lbs) 6     Shoulder Exercises: Stretch   External Rotation Stretch 1 rep;30 seconds  at wall, low and mid.    Other Shoulder Stretches 3 way doorway stretch, 30 sec each.      Modalities   Modalities Electrical Stimulation;Moist Heat     Moist Heat Therapy   Number Minutes Moist Heat 15 Minutes   Moist Heat Location Lumbar  Spine;Shoulder  Government social research officer Lt shoulder and lumbar   Electrical Stimulation Action premod   Electrical Stimulation Parameters to tolerance   Electrical Stimulation Goals Pain;Tone                     PT Long Term Goals - 06/06/17 9741      PT LONG TERM GOAL #1   Title I with advanced HEP ( 06/20/17)    Status On-going     PT LONG TERM GOAL #2   Title increase Lt shoulder ROM in standing to within 10 degrees of Rt without pain ( 06/20/17)      PT LONG TERM GOAL #3   Title improve Lt shoulder strength ER and mid traps =/> 5-/5 without pain ( 05/16/17)    Status Achieved     PT LONG TERM GOAL #4   Title maintain FOTO =/- 5 points from 14% limited ( 06/20/17)    Status On-going     PT LONG TERM GOAL #5   Title improve Lt hamstring flexibility =/> 80 degrees ( 06/20/17)    Status Achieved     PT LONG TERM GOAL #6   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  She has met some of her goals and progressing to the others.  She fatigues with treatment and with higher level strenghtening.     Rehab Potential Excellent   PT Frequency 1x / week   PT Duration 6 weeks   PT Treatment/Interventions Moist Heat;Ultrasound;Therapeutic exercise;Dry needling;Vasopneumatic Device;Manual techniques;Electrical Stimulation;Iontophoresis 35m/ml Dexamethasone;Cryotherapy;Patient/family education;Passive range of motion   PT Next Visit Plan pt will be out of town next week, re-assess at next visit, [place] Agree with Plan of Care Patient      Patient will benefit from skilled therapeutic intervention in order to improve the following deficits and impairments:  Decreased range of motion, Pain, Decreased strength, Impaired UE functional use, Impaired flexibility  Visit Diagnosis: Acute pain of left shoulder  Muscle weakness (generalized)  Pain in joint of right shoulder  Abnormal posture  Chronic left-sided low back pain without sciatica  Decreased strength, endurance, and mobility     Problem List Patient Active Problem List   Diagnosis Date Noted  . Adhesive capsulitis of left shoulder 05/10/2017  . Trochanteric bursitis of left hip 05/10/2017  . Thyroid nodule 09/20/2016  . Positive ANA (antinuclear antibody) 10/13/2015  . Lumbago 10/01/2015  . Rosacea 10/01/2015  . Gluten intolerance 09/07/2011  . FATIGUE 05/26/2010  . ANEMIA, IRON DEFICIENCY NOS 07/17/2007  . ALLERGIC RHINITIS, CHRONIC 05/27/2007    SJeral PinchPT  06/06/2017, 7:57 AM  [facility] [place] [address]Phone: [phone]  Fax:  [phone] Name: Tracie GerstenbergerMRN: [id number] of Birth: 107-08-1966

## 2017-06-13 ENCOUNTER — Ambulatory Visit (INDEPENDENT_AMBULATORY_CARE_PROVIDER_SITE_OTHER): Payer: Federal, State, Local not specified - PPO | Admitting: Physical Therapy

## 2017-06-13 DIAGNOSIS — M6281 Muscle weakness (generalized): Secondary | ICD-10-CM

## 2017-06-13 DIAGNOSIS — M545 Low back pain, unspecified: Secondary | ICD-10-CM

## 2017-06-13 DIAGNOSIS — R293 Abnormal posture: Secondary | ICD-10-CM | POA: Diagnosis not present

## 2017-06-13 DIAGNOSIS — G8929 Other chronic pain: Secondary | ICD-10-CM | POA: Diagnosis not present

## 2017-06-13 NOTE — Therapy (Signed)
[place], [place], [address] Phone: [phone]   Fax:  [phone]  Physical Therapy Treatment  Patient Details  Name: Tracie Schroeder MRN: [id number] Date of Birth: December 18, 1965 Referring Provider: Dr. Georgina Snell   Encounter Date: 06/13/2017      PT End of Session - 06/13/17 0739    Visit Number 7   Number of Visits 10   Date for PT Re-Evaluation 06/20/17   PT Start Time 0718   PT Stop Time 0800   PT Time Calculation (min) 42 min   Activity Tolerance Patient tolerated treatment well;No increased pain   Behavior During Therapy WFL for tasks assessed/performed      Past Medical History:  Diagnosis Date  . Anemia   . Arthritis   . Axillary lump    RT  . Gluten intolerance   . Thyroid disease    multinodular goiter  . Thyroid nodule     Past Surgical History:  Procedure Laterality Date  . HEMORRHOID SURGERY  12/2009  . TONSILLECTOMY    . TUBAL LIGATION    . uterine ablation      There were no vitals filed for this visit.      Subjective Assessment - 06/13/17 0904    Subjective Pt reports she no longer has any difficulty with Lt shoulder.  The prolonged ER stretch with weight has helped.  She has noticed she can tolerate sitting longer at work now that she stretches intermitently throughout day.  Today her main complaint is pain in Lt hip/Low back, especially with forward bending to get shoes on/ pick up stuff off of floor.    Currently in Pain? Yes   Pain Score 3    Pain Location Back   Pain Orientation Left;Lower   Pain Descriptors / Indicators Aching;Tightness   Aggravating Factors  forward bending of back   Pain Relieving Factors stretches, not bending over.             [facility] PT Assessment - 06/13/17 0001      Assessment   Medical Diagnosis Lt shoulder bursitis & lt sided LBP   Referring Provider Dr. Georgina Snell    Onset Date/Surgical Date 09/18/16   Hand Dominance Right   Next  MD Visit 06/21/17   Prior Therapy not for Lt shoulder, Rt shoulder 2 yrs ago     AROM   Left Shoulder Flexion 165 Degrees  no pain   Left Shoulder ABduction 162 Degrees  no pain   Left Shoulder External Rotation 85 Degrees  shoulder abduct 90 degrees in supine           [facility] Adult PT Treatment/Exercise - 06/13/17 0001      Lumbar Exercises: Stretches   Passive Hamstring Stretch 2 reps;30 seconds   Quadruped Mid Back Stretch 3 reps;30 seconds  childs pose with Rt lateral flexion   Piriformis Stretch 30 seconds;3 reps  each side.      Lumbar Exercises: Supine   Bridge 10 reps  with adductor squeeze     Modalities   Modalities Electrical Stimulation;Ultrasound     Acupuncturist Location Lt SI joint/lumbar paraspinals    Electrical Stimulation Action combo [place]   Electrical Stimulation Parameters to tolerance    Electrical Stimulation Goals Tone;Pain     Ultrasound   Ultrasound Location Lt SI joint, Lt lumbar paraspinals.    Ultrasound Parameters combo [place]- 100%, 1.2w/cm2, 1.26mz, 8 min     Ultrasound  Goals Pain;Other (Comment)  tightness     Manual Therapy   Manual Therapy Soft tissue mobilization;Myofascial release;Muscle Energy Technique   Soft tissue mobilization STM to Lt QL, Lt glute and piriformis.  TPR to glute med/min in sidelying with contract relax    Myofascial Release Lt QL, glute med   Muscle Energy Technique to correct posterior rotated innominate utilizing contract relax of Lt hip flexor with LE on table (5 sec hold, 5 sec relax press up)                      PT Long Term Goals - 06/13/17 0900      PT LONG TERM GOAL #1   Title I with advanced HEP ( 06/20/17)    Time 10   Period Weeks   Status On-going     PT LONG TERM GOAL #2   Title increase Lt shoulder ROM in standing to within 10 degrees of Rt without pain ( 06/20/17)    Period Weeks   Status Achieved     PT LONG TERM GOAL #3   Title improve Lt  shoulder strength ER and mid traps =/> 5-/5 without pain ( 05/16/17)    Time 4   Period Weeks   Status Achieved     PT LONG TERM GOAL #4   Title maintain FOTO =/- 5 points from 14% limited ( 06/20/17)    Time 10   Period Weeks   Status On-going     PT LONG TERM GOAL #5   Title improve Lt hamstring flexibility =/> 80 degrees ( 06/20/17)    Time 6   Period Weeks   Status Achieved     PT LONG TERM GOAL #6   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 She has met LTG #2.  Main complaint today was persistent Lt lowback/hip pain.  Lt inonminate appeared posteriorly rotated in standing; improved with manual therapy including muscle energy. Pt educated on improved body mechanics with squating to avoid flexed lumbar spine/pain in Lt SI joint.  Pt reported reduction in pain at end of session.  Progressing well towards remaining goals.    Rehab Potential Excellent   PT Frequency 1x / week   PT Duration 6 weeks   PT Treatment/Interventions Moist Heat;Ultrasound;Therapeutic exercise;Dry needling;Vasopneumatic Device;Manual techniques;Electrical Stimulation;Iontophoresis 75m/ml Dexamethasone;Cryotherapy;Patient/family education;Passive range of motion   PT Next Visit Plan pt will be out of town next week, re-assess at next visit, FOTO, MD note   Consulted and Agree with Plan of Care Patient      Patient will benefit from skilled therapeutic intervention in order to improve the following deficits and impairments:  Decreased range of motion, Pain, Decreased strength, Impaired UE  functional use, Impaired flexibility  Visit Diagnosis: Chronic left-sided low back pain without sciatica  Muscle weakness (generalized)  Abnormal posture     Problem List Patient Active Problem List  Diagnosis Date Noted  . Adhesive capsulitis of left shoulder 05/10/2017  . Trochanteric bursitis of left hip 05/10/2017  . Thyroid nodule 09/20/2016  . Positive ANA (antinuclear antibody) 10/13/2015  . Lumbago 10/01/2015  . Rosacea 10/01/2015  . Gluten intolerance 09/07/2011  . FATIGUE 05/26/2010  . ANEMIA, IRON DEFICIENCY NOS 07/17/2007  . ALLERGIC RHINITIS, CHRONIC 05/27/2007   Kerin Perna, PTA 06/13/17 9:15 AM  [place], [place], [address] Phone: [phone]   Fax:  [phone]  Name: Andersen Mckiver MRN: [id number] Date of Birth: Mar 28, 1966

## 2017-06-20 ENCOUNTER — Encounter: Payer: Federal, State, Local not specified - PPO | Admitting: Physical Therapy

## 2017-06-21 ENCOUNTER — Ambulatory Visit (INDEPENDENT_AMBULATORY_CARE_PROVIDER_SITE_OTHER): Payer: Federal, State, Local not specified - PPO | Admitting: Family Medicine

## 2017-06-21 ENCOUNTER — Encounter: Payer: Self-pay | Admitting: Physical Therapy

## 2017-06-21 ENCOUNTER — Ambulatory Visit (INDEPENDENT_AMBULATORY_CARE_PROVIDER_SITE_OTHER): Payer: Federal, State, Local not specified - PPO | Admitting: Physical Therapy

## 2017-06-21 ENCOUNTER — Ambulatory Visit: Payer: Federal, State, Local not specified - PPO | Admitting: Family Medicine

## 2017-06-21 VITALS — BP 129/84 | HR 66 | Ht 64.0 in | Wt 144.0 lb

## 2017-06-21 DIAGNOSIS — M6289 Other specified disorders of muscle: Secondary | ICD-10-CM

## 2017-06-21 DIAGNOSIS — G8929 Other chronic pain: Secondary | ICD-10-CM

## 2017-06-21 DIAGNOSIS — M545 Low back pain, unspecified: Secondary | ICD-10-CM

## 2017-06-21 DIAGNOSIS — R293 Abnormal posture: Secondary | ICD-10-CM | POA: Diagnosis not present

## 2017-06-21 DIAGNOSIS — M629 Disorder of muscle, unspecified: Secondary | ICD-10-CM

## 2017-06-21 DIAGNOSIS — M6281 Muscle weakness (generalized): Secondary | ICD-10-CM

## 2017-06-21 DIAGNOSIS — M25512 Pain in left shoulder: Secondary | ICD-10-CM | POA: Diagnosis not present

## 2017-06-21 NOTE — Patient Instructions (Addendum)
Bridging: with Straight Leg Raise    Start with one leg bent, other leg straight, lift buttocks off the floor. Keep stomach tight and hips level. Repeat __10__ times per set. Do _3___ sets per session. Do _1___ sessions per day.  Toe Touch    Lie on back, legs folded to chest, arms by sides. Exhale, lowering leg to just touch toes to mat. Inhale, returning knee to chest. Keep abdominals flat, navel to spine. Repeat __20-30__ times, alternating legs. Do _1___ sessions per day.   Single Leg Stretch    Lie on back, opposite hand holding knee to chest, other hand on same shin, other leg at 45. Exhale, curling up head and upper torso. Holding curl, inhale and change leg and hand positions. Exhale, changing back. Repeat __10__ changes with single breaths. Repeat _10___ changes in double time: 2 per inhale, 2 per exhale. NOTE: Keep navel to spine, back flat.  Supine: Leg Stretch with Strap (Super Advanced)    Lie on back with one leg straight. Hook strap around other foot. Straighten knee. Raise leg to maximal stretch and straighten knee further by tightening quadriceps. Slowly press other leg down as close to floor as possible. Keep lower abdominals tight. Hold _30-45__ seconds. Warning: Intense stretch. Stay within tolerance. Repeat _1-2__ times per session. Do _1__ sessions per day.   Outer Hip Stretch: Reclined IT Band Stretch (Strap)    Strap around opposite foot, pull across only as far as possible with shoulders on mat. Hold for __30-45__ secs. Repeat _1-2___ times each leg.   Hip Flexor Stretch: Proposal Pose    Maintain pelvic tilt, lift pubic bone toward navel. Engage posterior hip muscles (firm glute muscles of leg in back position). To increase stretch, maintain balance and ease hips forward. Hold for __30-45__ secs. Repeat _1-2___ times each leg.  Side Waist Stretch from Child's Pose    From child's pose, walk hands to left. Reach right hand out on diagonal.  Reach hips back toward heels making a C with torso. Breathe into right side waist. Hold for _5-6___ breaths. Repeat _1-2___ times each side.  Copyright  VHI. All rights reserved.

## 2017-06-21 NOTE — Patient Instructions (Signed)
Thank you for coming in today.  You should hear about the MRI in the next few days.   I recommend following up with me a few days after the MRI to go over results in detail.    Facet Joint Block The facet joints connect the bones of the spine (vertebrae). They make it possible for you to bend, twist, and make other movements with your spine. They also keep you from bending too far, twisting too far, and making other excessive movements. A facet joint block is a procedure where a numbing medicine (anesthetic) is injected into a facet joint. Often, a type of anti-inflammatory medicine called a steroid is also injected. A facet joint block may be done to diagnose neck or back pain. If the pain gets better after a facet joint block, it means the pain is probably coming from the facet joint. If the pain does not get better, it means the pain is probably not coming from the facet joint. A facet joint block may also be done to relieve neck or back pain caused by an inflamed facet joint. A facet joint block is only done to relieve pain if the pain does not improve with other methods, such as medicine, exercise programs, and physical therapy. Tell a health care provider about:  Any allergies you have.  All medicines you are taking, including vitamins, herbs, eye drops, creams, and over-the-counter medicines.  Any problems you or family members have had with anesthetic medicines.  Any blood disorders you have.  Any surgeries you have had.  Any medical conditions you have.  Whether you are pregnant or may be pregnant. What are the risks? Generally, this is a safe procedure. However, problems may occur, including:  Bleeding.  Injury to a nerve near the injection site.  Pain at the injection site.  Weakness or numbness in areas controlled by nerves near the injection site.  Infection.  Temporary fluid retention.  Allergic reactions to medicines or dyes.  Injury to other structures or  organs near the injection site.  What happens before the procedure?  Follow instructions from your health care provider about eating or drinking restrictions.  Ask your health care provider about: ? Changing or stopping your regular medicines. This is especially important if you are taking diabetes medicines or blood thinners. ? Taking medicines such as aspirin and ibuprofen. These medicines can thin your blood. Do not take these medicines before your procedure if your health care provider instructs you not to.  Do not take any new dietary supplements or medicines without asking your health care provider first.  Plan to have someone take you home after the procedure. What happens during the procedure?  You may need to remove your clothing and dress in an open-back gown.  The procedure will be done while you are lying on an X-ray table. You will most likely be asked to lie on your stomach, but you may be asked to lie in a different position if an injection will be made in your neck.  Machines will be used to monitor your oxygen levels, heart rate, and blood pressure.  If an injection will be made in your neck, an IV tube will be inserted into one of your veins. Fluids and medicine will flow directly into your body through the IV tube.  The area over the facet joint where the injection will be made will be cleaned with soap. The surrounding skin will be covered with clean drapes.  A numbing medicine (  local anesthetic) will be applied to your skin. Your skin may sting or burn for a moment.  A video X-ray machine (fluoroscopy) will be used to locate the joint. In some cases, a CT scan may be used.  A contrast dye may be injected into the facet joint area to help locate the joint.  When the joint is located, an anesthetic will be injected into the joint through the needle.  Your health care provider will ask you whether you feel pain relief. If you do feel relief, a steroid may be  injected to provide pain relief for a longer period of time. If you do not feel relief or feel only partial relief, additional injections of an anesthetic may be made in other facet joints.  The needle will be removed.  Your skin will be cleaned.  A bandage (dressing) will be applied over each injection site. The procedure may vary among health care providers and hospitals. What happens after the procedure?  You will be observed for 15-30 minutes before being allowed to go home. This information is not intended to replace advice given to you by your health care provider. Make sure you discuss any questions you have with your health care provider. Document Released: 03/21/2007 Document Revised: 12/01/2015 Document Reviewed: 07/26/2015 Elsevier Interactive Patient Education  [employer].

## 2017-06-21 NOTE — Therapy (Signed)
[place], [place], [address] Phone: [phone]   Fax:  [phone]  Physical Therapy Treatment  Patient Details  Name: Tylicia Sherman MRN: [id number] Date of Birth: 12/29/1965 Referring Provider: Dr Georgina Snell  Encounter Date: 06/21/2017      PT End of Session - 06/21/17 0807    Visit Number 8   PT Start Time 0808  pt in late   PT Stop Time 0902   PT Time Calculation (min) 54 min   Activity Tolerance Patient tolerated treatment well      Past Medical History:  Diagnosis Date  . Anemia   . Arthritis   . Axillary lump    RT  . Gluten intolerance   . Thyroid disease    multinodular goiter  . Thyroid nodule     Past Surgical History:  Procedure Laterality Date  . HEMORRHOID SURGERY  12/2009  . TONSILLECTOMY    . TUBAL LIGATION    . uterine ablation      There were no vitals filed for this visit.      Subjective Assessment - 06/21/17 0814    Subjective Pt reports her shoulder is doing great, she had one episode of LBP over the last week.  She thought she would be more sore yesterday as she was on a plane returning from vacation.  She feels like if she has some more exercise for her back she will be able to manage it.    Currently in Pain? No/denies            [facility] PT Assessment - 06/21/17 0001      Assessment   Medical Diagnosis Lt shoulder bursitis & lt sided LBP   Referring Provider Dr Georgina Snell   Onset Date/Surgical Date 09/18/16   Hand Dominance Right   Next MD Visit 06/21/17     Observation/Other Assessments   Focus on Therapeutic Outcomes (FOTO)  0% limited     AROM   AROM Assessment Site Shoulder;Lumbar   Right/Left Shoulder Right   Lumbar Flexion 4" from the floor with pull in Lt low back   Lumbar Extension WNL slight pain in Lt low back   Lumbar - Right Side Bend WNL   Lumbar - Left Side Bend WNL   Lumbar - Right Rotation WNL    Lumbar - Left Rotation WNL     Flexibility   Hamstrings supine SLR Rt 90, Lt 92                     [facility] Adult PT Treatment/Exercise - 06/21/17 0001      Lumbar Exercises: Stretches   Active Hamstring Stretch 2 reps;30 seconds   Quadruped Mid Back Stretch 2 reps  childs pose   Quad Stretch Limitations 2x30 sec low lunge hip flexor stretch   ITB Stretch 2 reps;30 seconds  cross body with strap     Lumbar Exercises: Aerobic   Stationary Bike NuStep L5: (arms/legs) x 5 min      Lumbar Exercises: Supine   Bridge --  3x10 single leg bridges   Other Supine Lumbar Exercises 2x10 pilates table top with heel taps and single leg stretch with curl     Modalities   Modalities Electrical Stimulation;Moist Heat     Moist Heat Therapy   Number Minutes Moist Heat 15 Minutes   Moist Heat Location Lumbar Spine;Shoulder     Electrical Stimulation   Electrical Stimulation Location Lt SI joint/lumbar paraspinals  Electrical Stimulation Action IFC   Electrical Stimulation Parameters to tolerance   Electrical Stimulation Goals Pain                PT Education - 06/21/17 [phone]    Education provided Yes   Education Details HEP progression for lumbar spine, continue with shoulder ex 3-4 times a week   Person(s) Educated Patient   Methods Explanation;Demonstration;Handout   Comprehension Returned demonstration;Verbalized understanding             PT Long Term Goals - 06/21/17 0815      PT LONG TERM GOAL #1   Title I with advanced HEP ( 06/20/17)    Status Achieved     PT LONG TERM GOAL #2   Title increase Lt shoulder ROM in standing to within 10 degrees of Rt without pain ( 06/20/17)    Status Achieved     PT LONG TERM GOAL #3   Title improve Lt shoulder strength ER and mid traps =/> 5-/5 without pain ( 05/16/17)    Status Achieved     PT LONG TERM GOAL #4   Title maintain FOTO =/- 5 points from 14% limited ( 06/20/17)    Status Achieved  scored 0% limited      PT LONG TERM GOAL #5    Title improve Lt hamstring flexibility =/> 80 degrees ( 06/20/17)    Status Achieved     PT LONG TERM GOAL #6   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      PT LONG TERM GOAL #7   Title demo strong contraction of the TA with core work, good pelvic stability ( 06/20/17)    Status Achieved               Plan - 06/21/17 0836    Clinical Impression Statement Iva has done excellent with PT, she has met all her goals and is pleased with her progress.  She has occassional Lt sided low back that she is managing with her TENs and HEP.  This should continue to improve.    PT Next Visit Plan discharge to HEP    Consulted and Agree with Plan of Care Patient      Patient will benefit from skilled therapeutic intervention in order to improve the following deficits and impairments:     Visit Diagnosis: Chronic left-sided low back pain without sciatica  Muscle weakness (generalized)  Abnormal posture  Acute pain of left shoulder  Muscular imbalance     Problem List Patient Active Problem List   Diagnosis Date Noted  . Adhesive capsulitis of left shoulder 05/10/2017  . Trochanteric bursitis of left hip 05/10/2017  . Thyroid nodule 09/20/2016  . Positive ANA (antinuclear antibody) 10/13/2015  . Lumbago 10/01/2015  . Rosacea 10/01/2015  . Gluten intolerance 09/07/2011  . FATIGUE 05/26/2010  . ANEMIA, IRON DEFICIENCY NOS 07/17/2007  . ALLERGIC RHINITIS, CHRONIC 05/27/2007    Jeral Pinch PT  06/21/2017, 8:48 AM  [facility] [place], [place], [address] Phone: [phone]   Fax:  [phone]  Name: Krystyne Tewksbury MRN: [id number] Date of Birth: [birth year]   PHYSICAL THERAPY DISCHARGE SUMMARY  Visits from Start of Care: 8  Current functional level related to goals / functional outcomes: See  above, full ROM Lt shoulder without pain, slight limitations in lumbar motion  Remaining deficits: See above   Education / Equipment: HEP  Plan: Patient agrees to discharge.  Patient goals were met. Patient is being discharged due to meeting the stated rehab goals.  ?????    Jeral Pinch, PT 06/21/17 8:48 AM

## 2017-06-21 NOTE — Progress Notes (Signed)
Tracie Schroeder is a 51 y.o. female who presents to [facility] today for follow-up back and shoulder pain.  Shoulder pain: Patient has been seen several times for left shoulder pain thought to be related to adhesive capsulitis. She has been attending physical therapy and notes complete resolution of symptoms.  However she notes that she also has had chronic back pain and ongoing at times. She has occasional exacerbations of back pain. She denies any radiating pain weakness or numbness fevers or chills. She notes that she's had some improvement in symptoms with physical therapy but not complete resolution of symptoms. The pain can be as bad as 5 out of 10 in time and does interfere with her quality of life.   Past Medical History:  Diagnosis Date  . Anemia   . Arthritis   . Axillary lump    RT  . Gluten intolerance   . Thyroid disease    multinodular goiter  . Thyroid nodule    Past Surgical History:  Procedure Laterality Date  . HEMORRHOID SURGERY  12/2009  . TONSILLECTOMY    . TUBAL LIGATION    . uterine ablation     Social History  Substance Use Topics  . Smoking status: Former Smoker    Quit date: 11/14/1991  . Smokeless tobacco: Never Used  . Alcohol use 1.0 oz/week    2 Standard drinks or equivalent per week     Comment: per week     ROS:  As above   Medications: Current Outpatient Prescriptions  Medication Sig Dispense Refill  . BIOTIN PO Take by mouth.    . cyclobenzaprine (FLEXERIL) 10 MG tablet Take 0.5-1 tablets (5-10 mg total) by mouth at bedtime as needed for muscle spasms. 30 tablet 2  . diclofenac sodium (VOLTAREN) 1 % GEL Apply 2 g topically 4 (four) times daily. To affected joint. 400 g 11  . EPINEPHrine 0.3 mg/0.3 mL IJ SOAJ injection Inject 0.3 mLs (0.3 mg total) into the muscle once. 1 Device 1  . Levomefolate Glucosamine (METHYLFOLATE PO) Take by mouth.    . loratadine (CLARITIN) 10 MG tablet Take 10 mg  by mouth daily.    . meloxicam (MOBIC) 15 MG tablet Take 15 mg by mouth as needed for pain.    . Methylcobalamin 2500 MCG SUBL Place 1 tablet under the tongue once a week.    . pantoprazole (PROTONIX) 40 MG tablet TAKE 1 TABLET BY MOUTH EVERY DAY 90 tablet 2  . traMADol (ULTRAM) 50 MG tablet Take 1 tablet (50 mg total) by mouth every 8 (eight) hours as needed. 45 tablet 0  . Ubiquinol 100 MG CAPS Take 1 capsule by mouth daily.     No current facility-administered medications for this visit.    Allergies  Allergen Reactions  . Adhesive [Tape] Itching  . Biaxin [Clarithromycin]   . Other     Almonds and hazelnuts     Exam:  BP 129/84   Pulse 66   Ht 5\' 4"  (1.626 m)   Wt 144 lb (65.3 kg)   BMI 24.72 kg/m  General: Well Developed, well nourished, and in no acute distress.  Neuro/Psych: Alert and oriented x3, extra-ocular muscles intact, able to move all 4 extremities, sensation grossly intact. Skin: Warm and dry, no rashes noted.  Respiratory: Not using accessory muscles, speaking in full sentences, trachea midline.  Cardiovascular: Pulses palpable, no extremity edema. Abdomen: Does not appear distended. MSK: L-spine nontender normal gait. Lower  extremity strength is intact  CLINICAL DATA:  Chronic left low back pain without radiation to the leg. Several months duration.  EXAM: LUMBAR SPINE - COMPLETE 4+ VIEW  COMPARISON:  None.  FINDINGS: Five lumbar type vertebral bodies. Disc space heights are within normal limits. No evidence of pars defect or advanced facet arthropathy. Mild facet degeneration at L4-5. No fracture or focal lesion. Sacroiliac joints appear normal.  IMPRESSION: Normal except for mild facet degeneration at L4-5.   Electronically Signed   By: Nelson Chimes M.D.   On: 10/01/2015 11:37     No results found for this or any previous visit (from the past 48 hour(s)). No results found.    Assessment and Plan: 51 y.o. female with lumbago  without radicular symptoms. This is ongoing despite conservative treatment. 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 Plan for MRIs that we can start planning for facet injections or medial branch blocks/ablation.  Follow-up after MRI.    Orders Placed This Encounter  Procedures  . MR Lumbar Spine Wo Contrast    Standing Status:   Future    Standing Expiration Date:   08/21/2018    Order Specific Question:   What is the patient's sedation requirement?    Answer:   No Sedation    Order Specific Question:   Does the patient have a pacemaker or implanted devices?    Answer:   No    Order Specific Question:   Preferred imaging location?    Answer:   [facility] (table limit-350lbs)    Order Specific Question:   Radiology Contrast Protocol - do NOT remove file path    Answer:   [email or web address]   Meds ordered this encounter  Medications  . Levomefolate Glucosamine (METHYLFOLATE PO)    Sig: Take by mouth.  Marland Kitchen BIOTIN PO    Sig: Take by mouth.    Discussed warning signs or symptoms. Please see discharge instructions. Patient expresses understanding.

## 2017-07-04 ENCOUNTER — Other Ambulatory Visit: Payer: Self-pay | Admitting: *Deleted

## 2017-07-04 MED ORDER — PANTOPRAZOLE SODIUM 40 MG PO TBEC: 40.0000 mg | DELAYED_RELEASE_TABLET | Freq: Every day | ORAL | 2 refills | Status: DC

## 2017-07-19 ENCOUNTER — Other Ambulatory Visit: Payer: Self-pay | Admitting: *Deleted

## 2017-07-19 DIAGNOSIS — M545 Low back pain, unspecified: Secondary | ICD-10-CM

## 2017-07-19 DIAGNOSIS — G8929 Other chronic pain: Secondary | ICD-10-CM

## 2017-07-19 MED ORDER — MELOXICAM 15 MG PO TABS: 15.0000 mg | ORAL_TABLET | ORAL | 1 refills | Status: DC | PRN

## 2017-08-13 ENCOUNTER — Ambulatory Visit (INDEPENDENT_AMBULATORY_CARE_PROVIDER_SITE_OTHER): Payer: Federal, State, Local not specified - PPO

## 2017-08-13 DIAGNOSIS — G8929 Other chronic pain: Secondary | ICD-10-CM

## 2017-08-13 DIAGNOSIS — M5137 Other intervertebral disc degeneration, lumbosacral region: Secondary | ICD-10-CM | POA: Diagnosis not present

## 2017-08-13 DIAGNOSIS — M5126 Other intervertebral disc displacement, lumbar region: Secondary | ICD-10-CM

## 2017-08-13 DIAGNOSIS — M545 Low back pain, unspecified: Secondary | ICD-10-CM

## 2017-08-20 ENCOUNTER — Telehealth: Payer: Self-pay | Admitting: Family Medicine

## 2017-08-20 ENCOUNTER — Ambulatory Visit (INDEPENDENT_AMBULATORY_CARE_PROVIDER_SITE_OTHER): Payer: Federal, State, Local not specified - PPO | Admitting: Family Medicine

## 2017-08-20 ENCOUNTER — Encounter: Payer: Self-pay | Admitting: Family Medicine

## 2017-08-20 VITALS — BP 147/87 | HR 77 | Ht 64.0 in | Wt 143.0 lb

## 2017-08-20 DIAGNOSIS — G8929 Other chronic pain: Secondary | ICD-10-CM | POA: Diagnosis not present

## 2017-08-20 DIAGNOSIS — Z1211 Encounter for screening for malignant neoplasm of colon: Secondary | ICD-10-CM

## 2017-08-20 DIAGNOSIS — M545 Low back pain, unspecified: Secondary | ICD-10-CM

## 2017-08-20 DIAGNOSIS — Z1231 Encounter for screening mammogram for malignant neoplasm of breast: Secondary | ICD-10-CM

## 2017-08-20 NOTE — Progress Notes (Signed)
Tracie Schroeder is a 51 y.o. female who presents to [facility] today for follow up back pain.   Tracie Schroeder has chronic back pain that has been ongoing for some time now.  Tracie Schroeder had an MRI recently to evaluate the cause of her pain. She has done some home physical therapy activities but not had much improvement. She denies any radiating pain. She notes chronic low back pain worse with sitting better with standing. She's takes meloxicam daily for pain which does help. She uses tramadol very intermittently. The pain is moderate and occurs every day.    Past Medical History:  Diagnosis Date  . Anemia   . Arthritis   . Axillary lump    RT  . Gluten intolerance   . Thyroid disease    multinodular goiter  . Thyroid nodule    Past Surgical History:  Procedure Laterality Date  . HEMORRHOID SURGERY  12/2009  . TONSILLECTOMY    . TUBAL LIGATION    . uterine ablation     Social History  Substance Use Topics  . Smoking status: Former Smoker    Quit date: 11/14/1991  . Smokeless tobacco: Never Used  . Alcohol use 1.0 oz/week    2 Standard drinks or equivalent per week     Comment: per week     ROS:  As above   Medications: Current Outpatient Prescriptions  Medication Sig Dispense Refill  . cyclobenzaprine (FLEXERIL) 10 MG tablet Take 0.5-1 tablets (5-10 mg total) by mouth at bedtime as needed for muscle spasms. 30 tablet 2  . diclofenac sodium (VOLTAREN) 1 % GEL Apply 2 g topically 4 (four) times daily. To affected joint. 400 g 11  . EPINEPHrine 0.3 mg/0.3 mL IJ SOAJ injection Inject 0.3 mLs (0.3 mg total) into the muscle once. 1 Device 1  . loratadine (CLARITIN) 10 MG tablet Take 10 mg by mouth daily.    . meloxicam (MOBIC) 15 MG tablet Take 1 tablet (15 mg total) by mouth as needed for pain. 60 tablet 1  . pantoprazole (PROTONIX) 40 MG tablet Take 1 tablet (40 mg total) by mouth daily. 90 tablet 2  . traMADol (ULTRAM) 50 MG tablet Take 1  tablet (50 mg total) by mouth every 8 (eight) hours as needed. 45 tablet 0  . Ubiquinol 100 MG CAPS Take 1 capsule by mouth daily.     No current facility-administered medications for this visit.    Allergies  Allergen Reactions  . Adhesive [Tape] Itching  . Biaxin [Clarithromycin]   . Other     Almonds and hazelnuts     Exam:  BP (!) 147/87   Pulse 77   Ht 5\' 4"  (1.626 m)   Wt 143 lb (64.9 kg)   BMI 24.55 kg/m  General: Well Developed, well nourished, and in no acute distress.  Neuro/Psych: Alert and oriented x3, extra-ocular muscles intact, able to move all 4 extremities, sensation grossly intact. Skin: Warm and dry, no rashes noted.  Respiratory: Not using accessory muscles, speaking in full sentences, trachea midline.  Cardiovascular: Pulses palpable, no extremity edema. Abdomen: Does not appear distended. MSK: L-spine normal motion and gait. Lower extremity strength is intact.   Study Result   CLINICAL DATA:  Chronic low back pain. Left upper leg pain which resolved with physical therapy.  EXAM: MRI LUMBAR SPINE WITHOUT CONTRAST  TECHNIQUE: Multiplanar, multisequence MR imaging of the lumbar spine was performed. No intravenous contrast was administered.  COMPARISON:  Lumbar  spine radiographs 10/01/2015  FINDINGS: Segmentation:  Standard.  Alignment:  Normal.  Vertebrae: No fracture. Mild degenerative endplate edema at K7-Q2, greater on the left. Multiple scattered vertebral hemangiomas, most notable at the L2, L4, and S3 levels.  Conus medullaris: Extends to the T12-L1 level and appears normal.  Paraspinal and other soft tissues: Unremarkable.  Disc levels:  L1-2 and L2-3:  Negative.  L3-4:  Mild disc desiccation.  Trace disc bulging without stenosis.  L4-5: Disc desiccation. Mild disc bulging and mild facet arthrosis without significant stenosis.  L5-S1: Disc desiccation. Mild disc bulging and endplate spurring result in mild left  neural foraminal stenosis without spinal stenosis.  IMPRESSION: 1. Mild L5-S1 disc degeneration with mild left neural foraminal stenosis. 2. Mild L4-5 disc bulging without stenosis.   Electronically Signed   By: Logan Bores M.D.   On: 08/13/2017 09:07       No results found for this or any previous visit (from the past 48 hour(s)). No results found.    Assessment and Plan: 51 y.o. female with chronic lumbago likely due to DDD. We had a long discussion about options. Plan for bilateral facet injections at L4-L5 and L5-S1. We are attempting to identify the pain generator. Recheck as needed.    Orders Placed This Encounter  Procedures  . DG FACET JT INJ L /S SINGLE LEVEL LEFT W/FL/CT    Order Specific Question:   Reason for exam:    Answer:   BL L4-L5 and L5-S1    Order Specific Question:   Is the patient pregnant?    Answer:   No    Order Specific Question:   Preferred imaging location?    Answer:   [facility]  . DG FACET JT INJ L /S SINGLE LEVEL RIGHT W/FL/CT    Order Specific Question:   Reason for exam:    Answer:   BL L4-L5 and L5-S1    Order Specific Question:   Is the patient pregnant?    Answer:   No    Order Specific Question:   Preferred imaging location?    Answer:   [facility]  . DG FACET JT INJ L /S  2ND LEVEL LEFT W/FL/CT    Standing Status:   Future    Standing Expiration Date:   10/21/2018    Order Specific Question:   Reason for Exam (SYMPTOM  OR DIAGNOSIS REQUIRED)    Answer:   BL L4-L5 and L5-S1    Order Specific Question:   Is the patient pregnant?    Answer:   No    Order Specific Question:   Preferred imaging location?    Answer:   [facility]  . DG FACET JT INJ L /S 2ND LEVEL RIGHT W/FL/CT    Standing Status:   Future    Standing Expiration Date:   10/21/2018    Order Specific Question:   Reason for Exam (SYMPTOM  OR DIAGNOSIS REQUIRED)    Answer:   BL L4-L5 and L5-S1    Order Specific Question:   Is the patient  pregnant?    Answer:   No    Order Specific Question:   Preferred imaging location?    Answer:   [facility]   No orders of the defined types were placed in this encounter.   Discussed warning signs or symptoms. Please see discharge instructions. Patient expresses understanding.

## 2017-08-20 NOTE — Telephone Encounter (Signed)
Gregor Hams, MD  Hali Marry, MD        I was seeing Tracie Schroeder today for her back. I noted that her colonoscopy will be due Oct 31st. Can you put a referral in for [facility]? I can do it but the report will come back to me if I do that. The same is true for mammo at the end of November.   I did however update her flu vaccine status.   Thanks,  Ellard Artis

## 2017-08-20 NOTE — Telephone Encounter (Signed)
Orders placed for mammogram and GI.Tracie Schroeder [place]

## 2017-08-20 NOTE — Patient Instructions (Signed)
Thank you for coming in today. You should hear form Radiology soon about the Facet Injections.  Let me know if you do not hear anything.

## 2017-08-20 NOTE — Telephone Encounter (Signed)
-----   Message from Hali Marry, MD sent at 08/20/2017 12:07 PM EDT ----- I was seeing Tracie Schroeder today for her back. I noted that her colonoscopy will be due Oct 31st. Can you put a referral in for [facility]? I can do it but the report will come back to me if I do that. The same is true for mammo at the end of November.   I did however update her flu vaccine status.  Thanks, Ellard Artis

## 2017-08-27 ENCOUNTER — Encounter: Payer: Self-pay | Admitting: Family Medicine

## 2017-09-05 ENCOUNTER — Ambulatory Visit
Admission: RE | Admit: 2017-09-05 | Discharge: 2017-09-05 | Disposition: A | Discharge status: Home / Self Care | Payer: Federal, State, Local not specified - PPO | Source: Ambulatory Visit | Attending: Family Medicine | Admitting: Family Medicine

## 2017-09-05 ENCOUNTER — Other Ambulatory Visit: Payer: Self-pay | Admitting: Family Medicine

## 2017-09-05 DIAGNOSIS — M545 Low back pain, unspecified: Secondary | ICD-10-CM

## 2017-09-05 DIAGNOSIS — M47817 Spondylosis without myelopathy or radiculopathy, lumbosacral region: Secondary | ICD-10-CM | POA: Diagnosis not present

## 2017-09-05 DIAGNOSIS — G8929 Other chronic pain: Secondary | ICD-10-CM

## 2017-09-05 MED ORDER — METHYLPREDNISOLONE ACETATE 40 MG/ML INJ SUSP (RADIOLOG
120.0000 mg | Freq: Once | INTRAMUSCULAR | Status: AC
  Administered 2017-09-05: 120 mg via INTRA_ARTICULAR

## 2017-09-05 MED ORDER — IOPAMIDOL (ISOVUE-M 200) INJECTION 41%
1.0000 mL | Freq: Once | INTRAMUSCULAR | Status: AC
  Administered 2017-09-05: 1 mL via INTRA_ARTICULAR

## 2017-09-05 NOTE — Discharge Instructions (Signed)

## 2017-09-28 ENCOUNTER — Ambulatory Visit: Payer: Federal, State, Local not specified - PPO | Admitting: Family Medicine

## 2017-09-28 ENCOUNTER — Encounter: Payer: Self-pay | Admitting: Family Medicine

## 2017-09-28 VITALS — BP 123/75 | HR 87 | Ht 64.0 in | Wt 140.0 lb

## 2017-09-28 DIAGNOSIS — Z0000 Encounter for general adult medical examination without abnormal findings: Secondary | ICD-10-CM

## 2017-09-28 DIAGNOSIS — L539 Erythematous condition, unspecified: Secondary | ICD-10-CM

## 2017-09-28 MED ORDER — BRIMONIDINE TARTRATE 0.33 % EX GEL: 1.0000 "application " | CUTANEOUS | 1 refills | Status: DC

## 2017-09-28 NOTE — Progress Notes (Signed)
Subjective:     Tracie Schroeder is a 51 y.o. female and is here for a comprehensive physical exam. The patient reports no problems. She is doing well overall. She is out of work right now as she is taking care of her husband who just had 5 vessel bypass.    Erythema of the nose-she has tried metronidazole gel and it was not helpful.   Social History   Socioeconomic History  . Marital status: Married    Spouse name: Not on file  . Number of children: 2   . Years of education: Not on file  . Highest education level: Not on file  Social Needs  . Financial resource strain: Not on file  . Food insecurity - worry: Not on file  . Food insecurity - inability: Not on file  . Transportation needs - medical: Not on file  . Transportation needs - non-medical: Not on file  Occupational History  . Occupation: pschiatrist    Comment: [employer] in [place]   Tobacco Use  . Smoking status: Former Smoker    Last attempt to quit: 11/14/1991    Years since quitting: 25.8  . Smokeless tobacco: Never Used  Substance and Sexual Activity  . Alcohol use: Yes    Alcohol/week: 1.0 oz    Types: 2 Standard drinks or equivalent per week    Comment: per week  . Drug use: No  . Sexual activity: Yes    Partners: Male    Comment: physician, married, on [employer].  Other Topics Concern  . Not on file  Social History Narrative   Has a son and daughter.  Some exercise.     Health Maintenance  Topic Date Due  . COLONOSCOPY  09/12/2017  . MAMMOGRAM  10/12/2017  . PAP SMEAR  08/23/2020  . TETANUS/TDAP  09/06/2021  . INFLUENZA VACCINE  Completed  . Hepatitis C Screening  Completed  . HIV Screening  Completed    The following portions of the patient's history were reviewed and updated as appropriate: allergies, current medications, past family history, past medical history, past social history, past surgical history and problem list.  Review of Systems A comprehensive review of systems was negative.    Objective:    BP 123/75   Pulse 87   Ht 5\' 4"  (1.626 m)   Wt 140 lb (63.5 kg)   SpO2 97%   BMI 24.03 kg/m  General appearance: alert, cooperative and appears stated age Head: Normocephalic, without obvious abnormality, atraumatic Eyes: conj clear, EOMI, PEERLA Ears: normal TM's and external ear canals both ears Nose: Nares normal. Septum midline. Mucosa normal. No drainage or sinus tenderness. Throat: lips, mucosa, and tongue normal; teeth and gums normal Neck: no adenopathy, no carotid bruit, no JVD, supple, symmetrical, trachea midline and thyroid not enlarged, symmetric, no tenderness/mass/nodules Back: symmetric, no curvature. ROM normal. No CVA tenderness. Lungs: clear to auscultation bilaterally Breasts: Inspection negative, Still has a very firm nodular area in that right lower breast which has been there.  Left breast exam was negative.  No palpable lumps or abnormalities. Heart: regular rate and rhythm, S1, S2 normal, no murmur, click, rub or gallop Abdomen: soft, non-tender; bowel sounds normal; no masses,  no organomegaly Extremities: extremities normal, atraumatic, no cyanosis or edema Pulses: 2+ and symmetric Skin: Skin color, texture, turgor normal. No rashes or lesions Lymph nodes: Cervical, supraclavicular, and axillary nodes normal. Neurologic: Alert and oriented X 3, normal strength and tone. Normal symmetric reflexes. Normal coordination and  gait    Assessment:    Healthy female exam.      Plan:     See After Visit Summary for Counseling Recommendations   Keep up a regular exercise program and make sure you are eating a healthy diet Try to eat 4 servings of dairy a day, or if you are lactose intolerant take a calcium with vitamin D daily.  Your vaccines are up to date.   Facial erythema-would like to try something else.  Will send in a prescription for Mirvaso.

## 2017-09-28 NOTE — Patient Instructions (Addendum)
Recommend a trial of my fitness pal, smart phone application to help track calories.  [place] (Health Maintenance, Female) Un estilo de vida saludable y los cuidados preventivos pueden favorecer considerablemente a la salud y Musician. Pregunte a su mdico cul es el cronograma de exmenes peridicos apropiado para usted. Esta es una buena oportunidad para consultarlo sobre cmo prevenir enfermedades y [place] sano. Adems de los controles, hay muchas otras cosas que puede hacer usted mismo. Los expertos han realizado numerosas investigaciones [employer] cambios en el estilo de vida y las medidas de prevencin que, [place], lo ayudarn a mantenerse sano. Solicite a su mdico ms informacin. EL PESO Y LA DIETA Consuma una dieta saludable.  Asegrese de [employer] verduras, frutas, productos lcteos de bajo contenido de [place] y Advertising account planner.  No consuma muchos alimentos de alto contenido de grasas slidas, azcares agregados o sal.  Realice actividad fsica con regularidad. Esta es una de las prcticas ms importantes que puede hacer por su salud. ? La Delorise Shiner de los adultos deben hacer ejercicio durante al menos 144mnutos por semana. El ejercicio debe aumentar la frecuencia cardaca y pActorla transpiracin (ejercicio de [place]. ? La mayora de los adultos tambin deben hacer ejercicios de elongacin al mToysRusveces a la semana. Agregue esto al su plan de ejercicio de intensidad moderada. Mantenga un peso saludable.  El ndice de masa corporal ([facility] es una medida que puede utilizarse para identificar posibles problemas de pSpringdale Proporciona una estimacin de la grasa corporal basndose en el peso y la altura. Su mdico puede ayudarle a dRadiation protection practitionerIMessiah Collegey a lScientist, forensico mTheatre managerun peso saludable.  Para las mujeres de 20aos o ms: ? Un IBay Area Center Sacred Heart Health Systemmenor de 18,5 se considera bajo peso. ? Un ISolar Surgical Center LLCentre 18,5 y 24,9 es normal. ? Un IProfessional Hosp Inc - Manati entre 25 y 29,9 se considera sobrepeso. ? Un IMC de 30 o ms se considera obesidad. Observe los niveles de colesterol y lpidos en la sangre.  Debe comenzar a rEnglish as a second language teacherde lpidos y cResearch officer, trade unionen la sangre a los 20aos y luego repetirlos cada 565aos  Es posible que nAutomotive engineerlos niveles de colesterol con mayor frecuencia si: ? Sus niveles de lpidos y colesterol son altos. ? Es mayor de 535DHR ? Presenta un alto riesgo de padecer enfermedades cardacas. DETECCIN DE CNCER Cncer de pulmn  Se recomienda realizar exmenes de deteccin de cncer de pulmn a personas adultas entre 567y 81aos que estn en riesgo de dHorticulturist, commercialde pulmn por sus antecedentes de consumo de tabaco.  Se recomienda una tomografa computarizada de baja dosis de los pulmones todos los aos a las personas que: ? Fuman actualmente. ? Hayan dejado el hbito en algn momento en los ltimos 15aos. ? Hayan fumado durante 30aos un paquete diario. Un paquete-ao equivale a fumar un promedio de un paquete de cigarrillos diario durante un ao.  Los exmenes de deteccin anuales deben continuar hasta que hayan pasado 15aos desde que dej de fumar.  Ya no debern realizarse si tiene un problema de salud que le impida recibir tratamiento para eScience writerde pulmn. Cncer de mama  Practique la autoconciencia de la mama. Esto significa reconocer la apariencia normal de sus mamas y cmo las siente.  Tambin significa realizar autoexmenes regulares de lJohnson & Johnson Informe a su mdico sobre cualquier cambio, sin importar cun pequeo sea.  Si tiene entre 20 y 3107aos, un mdico debe rAgricultural consultant  examen clnico de las mamas como parte del examen regular de [place], cada 1 a 3aos.  Si tiene 40aos o ms, debe Information systems manager clnico de las Microsoft. Tambin considere realizarse una [place]) todos los [place].  Si tiene antecedentes familiares de  cncer de mama, hable con su mdico para someterse a un estudio gentico.  Si tiene alto riesgo de Chief Financial Officer de mama, hable con su mdico para someterse a Public house manager y [employer].  La evaluacin del gen del cncer de mama (BRCA) se recomienda a mujeres que tengan familiares con cnceres relacionados con el BRCA. Los cnceres relacionados con el BRCA incluyen los siguientes: ? [place]. ? Ovario. ? Trompas. ? Cnceres de peritoneo.  Los resultados de la evaluacin determinarn la necesidad de asesoramiento gentico y de [place] de BRCA1 y BRCA2. Cncer de cuello del tero El mdico puede recomendarle que se haga pruebas peridicas de deteccin de cncer de los rganos de la pelvis (ovarios, tero y vagina). Estas pruebas incluyen un examen plvico, que abarca controlar si se produjeron cambios microscpicos en la superficie del cuello del tero (prueba de Papanicolaou). Pueden recomendarle que se haga estas pruebas cada 3aos, a partir de los 21aos.  A las mujeres que tienen entre 30 y 23aos, los mdicos pueden recomendarles que se sometan a exmenes plvicos y pruebas de Papanicolaou cada 77aos, o a la prueba de Papanicolaou y el examen plvico en combinacin con estudios de deteccin del virus del papiloma humano (VPH) cada 5aos. Algunos tipos de VPH aumentan el riesgo de Chief Financial Officer de cuello del tero. La prueba para la deteccin del VPH tambin puede realizarse a mujeres de cualquier edad cuyos resultados de la prueba de Papanicolaou no sean claros.  Es posible que otros mdicos no recomienden exmenes de deteccin a mujeres no embarazadas que se consideran sujetos de bajo riesgo de Chief Financial Officer de pelvis y que no tienen sntomas. Pregntele al mdico si un examen plvico de deteccin es adecuado para usted.  Si ha recibido un tratamiento para Science writer cervical o una enfermedad que podra causar cncer, necesitar realizarse una prueba de  Papanicolaou y controles durante al menos 41 aos de concluido el [place]. Si no se ha hecho el Papanicolaou con regularidad, debern volver a evaluarse los factores de riesgo (como tener un nuevo compaero sexual), para Teacher, adult education si debe realizarse los estudios nuevamente. Algunas mujeres sufren problemas mdicos que aumentan la probabilidad de Museum/gallery curator cncer de cuello del tero. En estos casos, el mdico podr [employer] se realicen controles y pruebas de Papanicolaou con ms frecuencia. Cncer colorrectal  Este tipo de cncer puede detectarse y a menudo prevenirse.  Por lo general, los estudios de rutina se deben Medical laboratory scientific officer a Field seismologist a Proofreader de los 5 aos y [place] 13 aos.  Sin embargo, el mdico podr aconsejarle que lo haga antes, si tiene factores de riesgo para el cncer de colon.  Tambin puede recomendarle que use un kit de prueba para Hydrologist en la materia fecal.  Es posible que se use una pequea cmara en el extremo de un tubo para examinar directamente el colon (sigmoidoscopia o colonoscopia) a fin de Hydrographic surveyor formas tempranas de cncer colorrectal.  Los exmenes de rutina generalmente comienzan a los 106aos.  El examen directo del colon se debe repetir cada 5 a 10aos hasta los 75aos. Sin embargo, es posible que se realicen exmenes con mayor frecuencia, si se detectan  formas tempranas de plipos precancerosos o pequeos bultos. Cncer de piel  Revise la piel de la cabeza a los pies con regularidad.  Informe a su mdico si aparecen nuevos lunares o los que tiene se modifican, especialmente en su forma y color.  Tambin notifique al mdico si tiene un lunar que es ms grande que el tamao de una goma de lpiz.  Siempre use pantalla solar. Aplique pantalla solar de [place] y repetida a lo largo del Training and development officer.  Protjase usando mangas y [employer], un sombrero de ala ancha y gafas para el sol, siempre que se encuentre en el exterior. ENFERMEDADES  CARDACAS, DIABETES E HIPERTENSIN ARTERIAL  La hipertensin arterial causa enfermedades cardacas y [place] el riesgo de ictus. La hipertensin arterial es ms probable en los siguientes casos: ? Las personas que tienen la presin arterial en el extremo del rango normal (100-139/85-89 mm Hg). ? [employer] con sobrepeso u obesidad. ? Scientist, water quality.  Si usted tiene entre 18 y 39 aos, debe medirse la presin arterial cada 3 a 5 aos. Si usted tiene 40 aos o ms, debe medirse la presin arterial [employer]. Debe medirse la presin arterial dos veces: una vez cuando est en un hospital o una clnica y la otra vez cuando est en otro sitio. Registre el promedio de [employer]. Para controlar su presin arterial cuando no est en un hospital o [place], puede usar lo siguiente: ? Jorje Guild automtica para medir la presin arterial en una farmacia. ? Un monitor para medir la presin arterial en el hogar.  Si tiene entre 59 y 65 aos, consulte a su mdico si debe tomar aspirina para prevenir el ictus.  Realcese exmenes de deteccin de la diabetes con regularidad. Esto incluye la toma de [place] de sangre para controlar el nivel de azcar en la sangre durante el [place]. ? Si tiene un peso normal y un bajo riesgo de padecer diabetes, realcese este anlisis cada tres aos despus de los 45aos. ? Si tiene sobrepeso y un alto riesgo de padecer diabetes, considere someterse a este anlisis antes o con mayor frecuencia. PREVENCIN DE INFECCIONES HepatitisB  Si tiene un riesgo ms alto de Museum/gallery curator hepatitis B, debe someterse a un examen de deteccin de este virus. Se considera que tiene un alto riesgo de contraer hepatitis B si: ? Naci en un pas donde la hepatitis B es frecuente. Pregntele a su mdico qu pases son considerados de Public affairs consultant. ? Sus padres nacieron en un pas de alto riesgo y usted no recibi una vacuna que lo proteja contra la hepatitis B (vacuna  contra la hepatitis B). ? [place]. ? [place] agujas para inyectarse drogas. ? Vive con alguien que tiene hepatitis B. ? Ha tenido sexo con alguien que tiene hepatitis B. ? Recibe tratamiento de hemodilisis. ? Toma ciertos medicamentos para el cncer, trasplante de rganos y afecciones autoinmunitarias. Hepatitis C  Se recomienda un anlisis de [place] para: ? [employer] 1945 y 1965. ? Todas las personas que tengan un riesgo de haber contrado hepatitis C. Enfermedades de transmisin sexual (ETS).  Debe realizarse pruebas de deteccin de enfermedades de transmisin sexual (ETS), incluidas gonorrea y clamidia si: ? Es sexualmente activo y es menor de 16WFU. ? Es mayor de 24aos, y Investment banker, operational informa que corre riesgo de tener este tipo de infecciones. ? La actividad sexual ha [place] desde que le hicieron la ltima prueba de deteccin  y tiene un riesgo mayor de tener clamidia o Radio broadcast assistant. Pregntele al mdico si usted tiene riesgo.  Si no tiene el VIH, pero corre riesgo de infectarse por el virus, se recomienda tomar diariamente un medicamento recetado para evitar la infeccin. Esto se conoce como profilaxis previa a la exposicin. Se considera que est en riesgo si: ? Es [place] sexualmente y no [place] preservativos habitualmente o no conoce el estado del VIH de sus Advertising copywriter. ? Se inyecta drogas. ? Es [place] sexualmente con Ardelia Mems pareja que tiene VIH. Consulte a su mdico para saber si tiene un alto riesgo de infectarse por el VIH. Si opta por comenzar la profilaxis previa a la exposicin, primero debe realizarse anlisis de deteccin del VIH. Luego, le harn anlisis cada 60mses mientras est tomando los medicamentos para la profilaxis previa a la exposicin. EC[facility] Si es premenopusica y puede quedar eMar[place] solicite a su mdico asesoramiento previo a la concepcin.  Si puede quedar embarazada, tome 400 a [id number](mcg) de cido fMarriott  Si desea evitar el embarazo, hable con su mdico sobre el control de la natalidad (anticoncepcin). OSTEOPOROSIS Y MENOPAUSIA  La osteoporosis es una enfermedad en la que los huesos pierden los minerales y la fuerza por el avance de la edad. El resultado pueden ser fracturas graves en los hDe[place] El riesgo de osteoporosis puede identificarse con uArdelia Memsprueba de densidad sea.  Si tiene 65aos o ms, o si est en riesgo de sufrir osteoporosis y fracturas, pregunte a su mdico si debe someterse a exmenes.  Consulte a su mdico si debe tomar un suplemento de calcio o de vitamina D para reducir el riesgo de osteoporosis.  La menopausia puede presentar ciertos sntomas fsicos y rGaffer  La terapia de reemplazo hormonal puede reducir algunos de estos sntomas y rGaffer Consulte a su mdico para saber si la terapia de reemplazo hormonal es conveniente para usted. INSTRUCCIONES PARA EL CUIDADO EN EL HOGAR  Realcese los estudios de rutina de la salud, dentales y de lPublic librarian  [place]  No consuma ningn producto que contenga tabaco, lo que incluye cigarrillos, tabaco de mHigher education careers advisero cPsychologist, sport and exercise  Si est embarazada, no beba alcohol.  Si est amamantando, reduzca el consumo de alcohol y la frecuencia con la que consume.  Si es mujer y no est embarazada limite el consumo de alcohol a no ms de 1 medida por da. Una medida equivale a 12onzas de cerveza, 5onzas de vino o 1onzas de bebidas alcohlicas de alta graduacin.  No consuma drogas.  No comparta agujas.  Solicite ayuda a su mdico si necesita apoyo o informacin para abandonar las drogas.  Informe a su mdico si a menudo se siente deprimido.  Notifique a su mdico si alguna vez ha sido vctima de abuso o si no se siente seguro en su hogar. Esta informacin no tiene cMarine scientistel consejo del mdico. Asegrese de hacerle al mdico cualquier pregunta que tenga. Document  Released: 10/19/2011 Document Revised: 11/20/2014 Document Reviewed: 08/03/2015 Elsevier Interactive Patient Education  2[employer]

## 2017-10-01 ENCOUNTER — Telehealth: Payer: Self-pay | Admitting: *Deleted

## 2017-10-01 NOTE — Telephone Encounter (Signed)
Pre Authorization sent to cover my meds.[place]

## 2017-10-03 LAB — COLOGUARD: COLOGUARD: NEGATIVE

## 2017-10-05 ENCOUNTER — Other Ambulatory Visit: Payer: Self-pay | Admitting: Family Medicine

## 2017-10-05 DIAGNOSIS — Z0000 Encounter for general adult medical examination without abnormal findings: Secondary | ICD-10-CM | POA: Diagnosis not present

## 2017-10-06 LAB — COMPREHENSIVE METABOLIC PANEL
ALK PHOS: 61 IU/L (ref 39–117)
ALT: 24 IU/L (ref 0–32)
AST: 24 IU/L (ref 0–40)
Albumin/Globulin Ratio: 1.7 (ref 1.2–2.2)
Albumin: 4.3 g/dL (ref 3.5–5.5)
BILIRUBIN TOTAL: 0.6 mg/dL (ref 0.0–1.2)
BUN / CREAT RATIO: 27 — AB (ref 9–23)
BUN: 20 mg/dL (ref 6–24)
CHLORIDE: 102 mmol/L (ref 96–106)
CO2: 24 mmol/L (ref 20–29)
Calcium: 9.7 mg/dL (ref 8.7–10.2)
Creatinine, Ser: 0.75 mg/dL (ref 0.57–1.00)
GFR calc Af Amer: 105 mL/min/{1.73_m2} (ref >59)
GFR calc non Af Amer: 91 mL/min/{1.73_m2} (ref >59)
GLOBULIN, TOTAL: 2.5 g/dL (ref 1.5–4.5)
Glucose: 82 mg/dL (ref 65–99)
Potassium: 4.4 mmol/L (ref 3.5–5.2)
SODIUM: 141 mmol/L (ref 134–144)
TOTAL PROTEIN: 6.8 g/dL (ref 6.0–8.5)

## 2017-10-06 LAB — LIPID PANEL W/O CHOL/HDL RATIO
Cholesterol, Total: 195 mg/dL (ref 100–199)
HDL: 93 mg/dL (ref >39)
LDL Calculated: 90 mg/dL (ref 0–99)
TRIGLYCERIDES: 60 mg/dL (ref 0–149)
VLDL Cholesterol Cal: 12 mg/dL (ref 5–40)

## 2017-10-06 LAB — CBC
Hematocrit: 40.1 % (ref 34.0–46.6)
Hemoglobin: 13.9 g/dL (ref 11.1–15.9)
MCH: 31.6 pg (ref 26.6–33.0)
MCHC: 34.7 g/dL (ref 31.5–35.7)
MCV: 91 fL (ref 79–97)
PLATELETS: 383 10*3/uL — AB (ref 150–379)
RBC: 4.4 x10E6/uL (ref 3.77–5.28)
RDW: 13.2 % (ref 12.3–15.4)
WBC: 5 10*3/uL (ref 3.4–10.8)

## 2017-10-06 LAB — TSH: TSH: 1.12 u[IU]/mL (ref 0.450–4.500)

## 2017-10-06 LAB — SPECIMEN STATUS REPORT

## 2017-10-11 NOTE — Telephone Encounter (Signed)
Can we try to get approved for rosacea?  Do you think we would be able to re-submit?

## 2017-10-11 NOTE — Telephone Encounter (Signed)
Yes I am going to resubmit for an appeal. I just wanted to see how they worded what the criteria should be in the letter they fax to [place] so I can make sure I word my letter effeciently

## 2017-10-11 NOTE — Telephone Encounter (Signed)
[facility] and per representative this medication has been denied. 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 The will fax [place] further information and inform the patient.

## 2017-10-17 ENCOUNTER — Encounter: Payer: Self-pay | Admitting: *Deleted

## 2017-10-17 NOTE — Telephone Encounter (Signed)
Appeal faxed today.

## 2017-10-25 NOTE — Telephone Encounter (Signed)
Please call patient and let her know that we have submitted an appeal for the topical rosacea treatment.  We are just waiting to hear back from them.

## 2017-10-26 NOTE — Telephone Encounter (Signed)
Patient advised.

## 2017-10-30 NOTE — Telephone Encounter (Signed)
mirvaso appeal has been approved. Approved from 09/19/2017 through 04/17/2018. Patient aware

## 2017-11-07 ENCOUNTER — Ambulatory Visit (INDEPENDENT_AMBULATORY_CARE_PROVIDER_SITE_OTHER): Payer: Federal, State, Local not specified - PPO

## 2017-11-07 DIAGNOSIS — Z1231 Encounter for screening mammogram for malignant neoplasm of breast: Secondary | ICD-10-CM | POA: Diagnosis not present

## 2017-12-18 ENCOUNTER — Encounter: Payer: Self-pay | Admitting: Family Medicine

## 2017-12-18 DIAGNOSIS — L719 Rosacea, unspecified: Secondary | ICD-10-CM

## 2017-12-20 ENCOUNTER — Encounter: Payer: Self-pay | Admitting: Family Medicine

## 2017-12-20 ENCOUNTER — Ambulatory Visit: Payer: Federal, State, Local not specified - PPO | Admitting: Family Medicine

## 2017-12-20 DIAGNOSIS — M67442 Ganglion, left hand: Secondary | ICD-10-CM | POA: Insufficient documentation

## 2017-12-20 DIAGNOSIS — M7542 Impingement syndrome of left shoulder: Secondary | ICD-10-CM | POA: Insufficient documentation

## 2017-12-20 NOTE — Patient Instructions (Signed)
Thank you for coming in today. Call or go to the ER if you develop a large red swollen joint with extreme pain or oozing puss.  Continue home exercises for shoulder tendonitis.  Take it easy with left finger.  Recheck as needed,  Let me know if you want to do formal PT.    Shoulder Impingement Syndrome Shoulder impingement syndrome is a condition that causes pain when connective tissues (tendons) surrounding the shoulder joint become pinched. These tendons are part of the group of muscles and tissues that help to stabilize the shoulder (rotator cuff). Beneath the rotator cuff is a fluid-filled sac (bursa) that allows the muscles and tendons to glide smoothly. The bursa may become swollen or irritated (bursitis). Bursitis, swelling in the rotator cuff tendons, or both conditions can decrease how much space is under a bone in the shoulder joint (acromion), resulting in impingement. What are the causes? Shoulder impingement syndrome can be caused by bursitis or swelling of the rotator cuff tendons, which may result from:  Repetitive overhead arm movements.  Falling onto the shoulder.  Weakness in the shoulder muscles.  What increases the risk? You may be more likely to develop this condition if you are an athlete who participates in:  Sports that involve throwing, such as baseball.  Tennis.  Swimming.  Volleyball.  Some people are also more likely to develop impingement syndrome because of the shape of their acromion bone. What are the signs or symptoms? The main symptom of this condition is pain on the front or side of the shoulder. Pain may:  Get worse when lifting or raising the arm.  Get worse at night.  Wake you up from sleeping.  Feel sharp when the shoulder is moved, and then fade to an ache.  Other signs and symptoms may include:  Tenderness.  Stiffness.  Inability to raise the arm above shoulder level or behind the body.  Weakness.  How is this  diagnosed? This condition may be diagnosed based on:  Your symptoms.  Your medical history.  A physical exam.  Imaging tests, such as: ? X-rays. ? MRI. ? Ultrasound.  How is this treated? Treatment for this condition may include:  Resting your shoulder and avoiding all activities that cause pain or put stress on the shoulder.  Icing your shoulder.  NSAIDs to help reduce pain and swelling.  One or more injections of medicines to numb the area and reduce inflammation.  Physical therapy.  Surgery. This may be needed if nonsurgical treatments have not helped. Surgery may involve repairing the rotator cuff, reshaping the acromion, or removing the bursa.  Follow these instructions at home: Managing pain, stiffness, and swelling  If directed, apply ice to the injured area. ? Put ice in a plastic bag. ? Place a towel between your skin and the bag. ? Leave the ice on for 20 minutes, 2-3 times a day. Activity  Rest and return to your normal activities as told by your health care provider. Ask your health care provider what activities are safe for you.  Do exercises as told by your health care provider. General instructions  Do not use any tobacco products, including cigarettes, chewing tobacco, or e-cigarettes. Tobacco can delay healing. If you need help quitting, ask your health care provider.  Ask your health care provider when it is safe for you to drive.  Take over-the-counter and prescription medicines only as told by your health care provider.  Keep all follow-up visits as told by your  health care provider. This is important. How is this prevented?  Give your body time to rest between periods of activity.  Be safe and responsible while being active to avoid falls.  Maintain physical fitness, including strength and flexibility. Contact a health care provider if:  Your symptoms have not improved after 1-2 months of treatment and rest.  You cannot lift your arm  away from your body. This information is not intended to replace advice given to you by your health care provider. Make sure you discuss any questions you have with your health care provider. Document Released: 10/30/2005 Document Revised: 07/06/2016 Document Reviewed: 10/02/2015 Elsevier Interactive Patient Education  2018 [employer].   Ganglion Cyst A ganglion cyst is a noncancerous, fluid-filled lump that occurs near joints or tendons. The ganglion cyst grows out of a joint or the lining of a tendon. It most often develops in the hand or wrist, but it can also develop in the shoulder, elbow, hip, knee, ankle, or foot. The round or oval ganglion cyst can be the size of a pea or larger than a grape. Increased activity may enlarge the size of the cyst because more fluid starts to build up. What are the causes? It is not known what causes a ganglion cyst to grow. However, it may be related to:  Inflammation or irritation around the joint.  An injury.  Repetitive movements or overuse.  Arthritis.  What increases the risk? Risk factors include:  Being a woman.  Being age 79-50.  What are the signs or symptoms? Symptoms may include:  A lump. This most often appears on the hand or wrist, but it can occur in other areas of the body.  Tingling.  Pain.  Numbness.  Muscle weakness.  Weak grip.  Less movement in a joint.  How is this diagnosed? Ganglion cysts are most often diagnosed based on a physical exam. Your health care provider will feel the lump and may shine a light alongside it. If it is a ganglion cyst, a light often shines through it. Your health care provider may order an X-ray, ultrasound, or MRI to rule out other conditions. How is this treated? Ganglion cysts usually go away on their own without treatment. If pain or other symptoms are involved, treatment may be needed. Treatment is also needed if the ganglion cyst limits your movement or if it gets infected.  Treatment may include:  Wearing a brace or splint on your wrist or finger.  Taking anti-inflammatory medicine.  Draining fluid from the lump with a needle (aspiration).  Injecting a steroid into the joint.  Surgery to remove the ganglion cyst.  Follow these instructions at home:  Do not press on the ganglion cyst, poke it with a needle, or hit it.  Take medicines only as directed by your health care provider.  Wear your brace or splint as directed by your health care provider.  Watch your ganglion cyst for any changes.  Keep all follow-up visits as directed by your health care provider. This is important. Contact a health care provider if:  Your ganglion cyst becomes larger or more painful.  You have increased redness, red streaks, or swelling.  You have pus coming from the lump.  You have weakness or numbness in the affected area.  You have a fever or chills. This information is not intended to replace advice given to you by your health care provider. Make sure you discuss any questions you have with your health care provider. Document  Released: 10/27/2000 Document Revised: 04/06/2016 Document Reviewed: 04/14/2014 Elsevier Interactive Patient Education  [employer].

## 2017-12-20 NOTE — Progress Notes (Signed)
Tracie Schroeder is a 53 y.o. female who presents to [facility] today for left shoulder pain.    Tracie Schroeder notes a 102-month history of pain in the left shoulder.  Pain is located in the lateral upper arm and is worse with abduction.  She denies any radiating pain weakness or numbness.  She notes the symptoms are not consistent with prior episodes of adhesive capsulitis in the shoulder.  She has been doing her home exercises that she learned in physical therapy and has not gotten much better.  She denies any injury.  She feels pretty well otherwise with no fevers or chills.   Additionally Tracie Schroeder notes a small bump at the left second MCP on the palmar aspect.  This is occasionally painful and obnoxious.  Is been present for a few weeks and is not improving with relative rest.  She cannot recall any injury to explain the onset of symptoms.  She denies any triggering of the finger.   Past Medical History:  Diagnosis Date  . Anemia   . Arthritis   . Axillary lump    RT  . Gluten intolerance   . Thyroid disease    multinodular goiter  . Thyroid nodule    Past Surgical History:  Procedure Laterality Date  . HEMORRHOID SURGERY  12/2009  . TONSILLECTOMY    . TUBAL LIGATION    . uterine ablation     Social History   Tobacco Use  . Smoking status: Former Smoker    Last attempt to quit: 11/14/1991    Years since quitting: 26.1  . Smokeless tobacco: Never Used  Substance Use Topics  . Alcohol use: Yes    Alcohol/week: 1.0 oz    Types: 2 Standard drinks or equivalent per week    Comment: per week     ROS:  As above   Medications: Current Outpatient Medications  Medication Sig Dispense Refill  . Brimonidine Tartrate (MIRVASO) 0.33 % GEL Apply 1 application every morning topically. 30 g 1  . CALCIUM-MAGNESIUM-ZINC PO Take by mouth.    . cyclobenzaprine (FLEXERIL) 10 MG tablet Take 0.5-1 tablets (5-10 mg total) by mouth at bedtime as needed for  muscle spasms. 30 tablet 2  . diclofenac sodium (VOLTAREN) 1 % GEL Apply 2 g topically 4 (four) times daily. To affected joint. 400 g 11  . EPINEPHrine 0.3 mg/0.3 mL IJ SOAJ injection Inject 0.3 mLs (0.3 mg total) into the muscle once. 1 Device 1  . Levomefolate Glucosamine (METHYLFOLATE) 400 MCG CAPS Take by mouth.    . loratadine (CLARITIN) 10 MG tablet Take 10 mg by mouth daily.    . meloxicam (MOBIC) 15 MG tablet Take 1 tablet (15 mg total) by mouth as needed for pain. 60 tablet 1  . Nutritional Supplements (ESTROVEN PO) Take by mouth.    . pantoprazole (PROTONIX) 40 MG tablet Take 1 tablet (40 mg total) by mouth daily. 90 tablet 2  . traMADol (ULTRAM) 50 MG tablet Take 1 tablet (50 mg total) by mouth every 8 (eight) hours as needed. 45 tablet 0  . Ubiquinol 100 MG CAPS Take 1 capsule by mouth daily.     No current facility-administered medications for this visit.    Allergies  Allergen Reactions  . Adhesive [Tape] Itching  . Biaxin [Clarithromycin]   . Other     Almonds and hazelnuts     Exam:  BP 137/82   Pulse 69   Ht 5\' 4"  (1.626  m)   Wt 141 lb (64 kg)   BMI 24.20 kg/m  General: Well Developed, well nourished, and in no acute distress.  Neuro/Psych: Alert and oriented x3, extra-ocular muscles intact, able to move all 4 extremities, sensation grossly intact. Skin: Warm and dry, no rashes noted.  Respiratory: Not using accessory muscles, speaking in full sentences, trachea midline.  Cardiovascular: Pulses palpable, no extremity edema. Abdomen: Does not appear distended. MSK:  Left shoulder normal-appearing nontender. Normal range of motion pain with abduction. Normal strength. Positive Hawkins and Neer's test. Positive empty can test. Positive crossover arm compression test. Pulses capillary refill and sensation are intact distally  Left palmar hand at second MCP small nodule palpated overlying the joint minimally tender. Normal finger and hand motion with no  triggering.  Normal strength capillary refill and sensation.  Limited musculoskeletal ultrasound of the left second MCP reveals a small ganglion cyst superficial to the flexor tendon sheath overlying the second MCP.  Normal bony structures.  Limited musculoskeletal ultrasound of the left shoulder reveals a thickened subacromial bursa overlying a normal-appearing supraspinatus tendon and humerus.   Procedure: Real-time Ultrasound Guided Injection of left subacromial bursa Device: GE Logiq E  Images permanently stored and available for review in the ultrasound unit. Verbal informed consent obtained. Discussed risks and benefits of procedure. Warned about infection bleeding damage to structures skin hypopigmentation and fat atrophy among others. Patient expresses understanding and agreement Time-out conducted.  Noted no overlying erythema, induration, or other signs of local infection.  Skin prepped in a sterile fashion.  Local anesthesia: Topical Ethyl chloride.  With sterile technique and under real time ultrasound guidance:40 mg of Kenalog and 3 mL of Marcaine injected easily.  Completed without difficulty  Pain immediately resolved suggesting accurate placement of the medication.  Advised to call if fevers/chills, erythema, induration, drainage, or persistent bleeding.  Images permanently stored and available for review in the ultrasound unit.  Impression: Technically successful ultrasound guided injection.   Procedure: Real-time Ultrasound Guided Injection of left second MCP ganglion cyst Device: GE Logiq E  Images permanently stored and available for review in the ultrasound unit. Verbal informed consent obtained. Discussed risks and benefits of procedure. Warned about infection bleeding damage to structures skin hypopigmentation and fat atrophy among others. Patient expresses understanding and agreement Time-out conducted.  Noted no overlying erythema, induration, or  other signs of local infection.  Skin prepped in a sterile fashion.  Local anesthesia: Topical Ethyl chloride.  With sterile technique and under real time ultrasound guidance: 5mg  dexamethasone and 0.5 mL of Marcaine injected easily.  Completed without difficulty  Advised to call if fevers/chills, erythema, induration, drainage, or persistent bleeding.  Images permanently stored and available for review in the ultrasound unit.  Impression: Technically successful ultrasound guided injection.     No results found for this or any previous visit (from the past 48 hour(s)). No results found.    Assessment and Plan: 52 y.o. female with  Left shoulder pain is likely subacromial bursitis impingement or tendinitis based on exam ultrasound appearance and resolution of pain following injection.  Treat with injection as above as well as continued home exercise program.  Recheck in the near future if not better.  Left second MCP ganglion cyst associated with flexor tendon sheath status post injection today.  Plan for relative rest and recheck as needed.    No orders of the defined types were placed in this encounter.  No orders of the defined types were placed in  this encounter.   Discussed warning signs or symptoms. Please see discharge instructions. Patient expresses understanding.

## 2018-02-25 DIAGNOSIS — L718 Other rosacea: Secondary | ICD-10-CM | POA: Diagnosis not present

## 2018-05-02 DIAGNOSIS — K080 Exfoliation of teeth due to systemic causes: Secondary | ICD-10-CM | POA: Diagnosis not present

## 2018-06-12 DIAGNOSIS — M9905 Segmental and somatic dysfunction of pelvic region: Secondary | ICD-10-CM | POA: Diagnosis not present

## 2018-06-12 DIAGNOSIS — M9903 Segmental and somatic dysfunction of lumbar region: Secondary | ICD-10-CM | POA: Diagnosis not present

## 2018-06-12 DIAGNOSIS — M25551 Pain in right hip: Secondary | ICD-10-CM | POA: Diagnosis not present

## 2018-06-12 DIAGNOSIS — M5116 Intervertebral disc disorders with radiculopathy, lumbar region: Secondary | ICD-10-CM | POA: Diagnosis not present

## 2018-06-15 DIAGNOSIS — M9903 Segmental and somatic dysfunction of lumbar region: Secondary | ICD-10-CM | POA: Diagnosis not present

## 2018-06-15 DIAGNOSIS — M5116 Intervertebral disc disorders with radiculopathy, lumbar region: Secondary | ICD-10-CM | POA: Diagnosis not present

## 2018-06-15 DIAGNOSIS — M9905 Segmental and somatic dysfunction of pelvic region: Secondary | ICD-10-CM | POA: Diagnosis not present

## 2018-06-15 DIAGNOSIS — M25551 Pain in right hip: Secondary | ICD-10-CM | POA: Diagnosis not present

## 2018-06-18 DIAGNOSIS — M5116 Intervertebral disc disorders with radiculopathy, lumbar region: Secondary | ICD-10-CM | POA: Diagnosis not present

## 2018-06-18 DIAGNOSIS — M9905 Segmental and somatic dysfunction of pelvic region: Secondary | ICD-10-CM | POA: Diagnosis not present

## 2018-06-18 DIAGNOSIS — M25551 Pain in right hip: Secondary | ICD-10-CM | POA: Diagnosis not present

## 2018-06-18 DIAGNOSIS — M9903 Segmental and somatic dysfunction of lumbar region: Secondary | ICD-10-CM | POA: Diagnosis not present

## 2018-06-21 ENCOUNTER — Other Ambulatory Visit: Payer: Self-pay | Admitting: Family Medicine

## 2018-06-21 DIAGNOSIS — M9905 Segmental and somatic dysfunction of pelvic region: Secondary | ICD-10-CM | POA: Diagnosis not present

## 2018-06-21 DIAGNOSIS — M9902 Segmental and somatic dysfunction of thoracic region: Secondary | ICD-10-CM | POA: Diagnosis not present

## 2018-06-21 DIAGNOSIS — M5116 Intervertebral disc disorders with radiculopathy, lumbar region: Secondary | ICD-10-CM | POA: Diagnosis not present

## 2018-06-21 DIAGNOSIS — M9903 Segmental and somatic dysfunction of lumbar region: Secondary | ICD-10-CM | POA: Diagnosis not present

## 2018-06-21 DIAGNOSIS — M9904 Segmental and somatic dysfunction of sacral region: Secondary | ICD-10-CM | POA: Diagnosis not present

## 2018-06-21 DIAGNOSIS — M25551 Pain in right hip: Secondary | ICD-10-CM | POA: Diagnosis not present

## 2018-06-24 DIAGNOSIS — M5116 Intervertebral disc disorders with radiculopathy, lumbar region: Secondary | ICD-10-CM | POA: Diagnosis not present

## 2018-06-24 DIAGNOSIS — M25551 Pain in right hip: Secondary | ICD-10-CM | POA: Diagnosis not present

## 2018-06-24 DIAGNOSIS — M9905 Segmental and somatic dysfunction of pelvic region: Secondary | ICD-10-CM | POA: Diagnosis not present

## 2018-06-24 DIAGNOSIS — M9903 Segmental and somatic dysfunction of lumbar region: Secondary | ICD-10-CM | POA: Diagnosis not present

## 2018-06-26 DIAGNOSIS — M5116 Intervertebral disc disorders with radiculopathy, lumbar region: Secondary | ICD-10-CM | POA: Diagnosis not present

## 2018-06-26 DIAGNOSIS — M9905 Segmental and somatic dysfunction of pelvic region: Secondary | ICD-10-CM | POA: Diagnosis not present

## 2018-06-26 DIAGNOSIS — M25551 Pain in right hip: Secondary | ICD-10-CM | POA: Diagnosis not present

## 2018-06-26 DIAGNOSIS — M9903 Segmental and somatic dysfunction of lumbar region: Secondary | ICD-10-CM | POA: Diagnosis not present

## 2018-07-01 ENCOUNTER — Other Ambulatory Visit: Payer: Self-pay | Admitting: Family Medicine

## 2018-07-01 DIAGNOSIS — M25551 Pain in right hip: Secondary | ICD-10-CM | POA: Diagnosis not present

## 2018-07-01 DIAGNOSIS — M9903 Segmental and somatic dysfunction of lumbar region: Secondary | ICD-10-CM | POA: Diagnosis not present

## 2018-07-01 DIAGNOSIS — M9905 Segmental and somatic dysfunction of pelvic region: Secondary | ICD-10-CM | POA: Diagnosis not present

## 2018-07-01 DIAGNOSIS — M5116 Intervertebral disc disorders with radiculopathy, lumbar region: Secondary | ICD-10-CM | POA: Diagnosis not present

## 2018-07-08 DIAGNOSIS — M5116 Intervertebral disc disorders with radiculopathy, lumbar region: Secondary | ICD-10-CM | POA: Diagnosis not present

## 2018-07-08 DIAGNOSIS — M25551 Pain in right hip: Secondary | ICD-10-CM | POA: Diagnosis not present

## 2018-07-08 DIAGNOSIS — M9905 Segmental and somatic dysfunction of pelvic region: Secondary | ICD-10-CM | POA: Diagnosis not present

## 2018-07-08 DIAGNOSIS — M9903 Segmental and somatic dysfunction of lumbar region: Secondary | ICD-10-CM | POA: Diagnosis not present

## 2018-07-17 DIAGNOSIS — M5116 Intervertebral disc disorders with radiculopathy, lumbar region: Secondary | ICD-10-CM | POA: Diagnosis not present

## 2018-07-17 DIAGNOSIS — M9905 Segmental and somatic dysfunction of pelvic region: Secondary | ICD-10-CM | POA: Diagnosis not present

## 2018-07-17 DIAGNOSIS — M9903 Segmental and somatic dysfunction of lumbar region: Secondary | ICD-10-CM | POA: Diagnosis not present

## 2018-07-17 DIAGNOSIS — M25551 Pain in right hip: Secondary | ICD-10-CM | POA: Diagnosis not present

## 2018-08-07 DIAGNOSIS — M5116 Intervertebral disc disorders with radiculopathy, lumbar region: Secondary | ICD-10-CM | POA: Diagnosis not present

## 2018-08-07 DIAGNOSIS — M9905 Segmental and somatic dysfunction of pelvic region: Secondary | ICD-10-CM | POA: Diagnosis not present

## 2018-08-07 DIAGNOSIS — M25551 Pain in right hip: Secondary | ICD-10-CM | POA: Diagnosis not present

## 2018-08-07 DIAGNOSIS — M9903 Segmental and somatic dysfunction of lumbar region: Secondary | ICD-10-CM | POA: Diagnosis not present

## 2018-09-16 ENCOUNTER — Other Ambulatory Visit: Payer: Self-pay | Admitting: Family Medicine

## 2018-10-03 LAB — COLOGUARD: Cologuard: NEGATIVE

## 2018-10-14 ENCOUNTER — Ambulatory Visit (INDEPENDENT_AMBULATORY_CARE_PROVIDER_SITE_OTHER): Payer: Federal, State, Local not specified - PPO | Admitting: Family Medicine

## 2018-10-14 ENCOUNTER — Encounter: Payer: Self-pay | Admitting: Family Medicine

## 2018-10-14 VITALS — BP 133/72 | HR 96 | Ht 63.39 in | Wt 142.0 lb

## 2018-10-14 DIAGNOSIS — R61 Generalized hyperhidrosis: Secondary | ICD-10-CM | POA: Diagnosis not present

## 2018-10-14 DIAGNOSIS — Z23 Encounter for immunization: Secondary | ICD-10-CM | POA: Diagnosis not present

## 2018-10-14 DIAGNOSIS — Z0000 Encounter for general adult medical examination without abnormal findings: Secondary | ICD-10-CM

## 2018-10-14 DIAGNOSIS — R5383 Other fatigue: Secondary | ICD-10-CM

## 2018-10-14 DIAGNOSIS — M2550 Pain in unspecified joint: Secondary | ICD-10-CM | POA: Diagnosis not present

## 2018-10-14 DIAGNOSIS — Z832 Family history of diseases of the blood and blood-forming organs and certain disorders involving the immune mechanism: Secondary | ICD-10-CM | POA: Diagnosis not present

## 2018-10-14 MED ORDER — MONTELUKAST SODIUM 10 MG PO TABS: 10.0000 mg | ORAL_TABLET | Freq: Every day | ORAL | 3 refills | Status: DC

## 2018-10-14 MED ORDER — TRAMADOL HCL 50 MG PO TABS: 50.0000 mg | ORAL_TABLET | Freq: Four times a day (QID) | ORAL | 0 refills | Status: DC | PRN

## 2018-10-14 MED ORDER — EPINEPHRINE 0.3 MG/0.3ML IJ SOAJ: INTRAMUSCULAR | 99 refills | Status: DC

## 2018-10-14 NOTE — Patient Instructions (Signed)
[place] (Health Maintenance, Female) Un estilo de vida saludable y los cuidados preventivos pueden favorecer considerablemente a la salud y Musician. Pregunte a su mdico cul es el cronograma de exmenes peridicos apropiado para usted. Esta es una buena oportunidad para consultarlo sobre cmo prevenir enfermedades y [place] sano. Adems de los controles, hay muchas otras cosas que puede hacer usted mismo. Los expertos han realizado numerosas investigaciones [employer] cambios en el estilo de vida y las medidas de prevencin que, [place], lo ayudarn a mantenerse sano. Solicite a su mdico ms informacin. EL PESO Y LA DIETA Consuma una dieta saludable.  Asegrese de [employer] verduras, frutas, productos lcteos de bajo contenido de [place] y Advertising account planner.  No consuma muchos alimentos de alto contenido de grasas slidas, azcares agregados o sal.  Realice actividad fsica con regularidad. Esta es una de las prcticas ms importantes que puede hacer por su salud. ? La Delorise Shiner de los adultos deben hacer ejercicio durante al menos 124mnutos por semana. El ejercicio debe aumentar la frecuencia cardaca y pActorla transpiracin (ejercicio de i[place]. ? La mayora de los adultos tambin deben hacer ejercicios de elongacin al mToysRusveces a la semana. Agregue esto al su plan de ejercicio de intensidad moderada. Mantenga un peso saludable.  El ndice de masa corporal ([facility] es una medida que puede utilizarse para identificar posibles problemas de pEast Uniontown Proporciona una estimacin de la grasa corporal basndose en el peso y la altura. Su mdico puede ayudarle a dRadiation protection practitionerISouth Endy a lScientist, forensico mTheatre managerun peso saludable.  Para las mujeres de 20aos o ms: ? Un IJohn R. Oishei Children'S Hospitalmenor de 18,5 se considera bajo peso. ? Un ICumberland County Hospitalentre 18,5 y 24,9 es normal. ? Un IPelham Medical Centerentre 25 y 29,9 se considera sobrepeso. ? Un IMC de 30 o ms se considera  obesidad. Observe los niveles de colesterol y lpidos en la sangre.  Debe comenzar a rEnglish as a second language teacherde lpidos y cResearch officer, trade unionen la sangre a los 20aos y luego repetirlos cada 516aos  Es posible que nAutomotive engineerlos niveles de colesterol con mayor frecuencia si: ? Sus niveles de lpidos y colesterol son altos. ? Es mayor de 527CWC ? Presenta un alto riesgo de padecer enfermedades cardacas. DETECCIN DE CNCER Cncer de pulmn  Se recomienda realizar exmenes de deteccin de cncer de pulmn a personas adultas entre 574y 892aos que estn en riesgo de dHorticulturist, commercialde pulmn por sus antecedentes de consumo de tabaco.  Se recomienda una tomografa computarizada de baja dosis de los pulmones todos los aos a las personas que: ? Fuman actualmente. ? Hayan dejado el hbito en algn momento en los ltimos 15aos. ? Hayan fumado durante 30aos un paquete diario. Un paquete-ao equivale a fumar un promedio de un paquete de cigarrillos diario durante un ao.  Los exmenes de deteccin anuales deben continuar hasta que hayan pasado 15aos desde que dej de fumar.  Ya no debern realizarse si tiene un problema de salud que le impida recibir tratamiento para eScience writerde pulmn. Cncer de mama  Practique la autoconciencia de la mama. Esto significa reconocer la apariencia normal de sus mamas y cmo las siente.  Tambin significa realizar autoexmenes regulares de lJohnson & Johnson Informe a su mdico sobre cualquier cambio, sin importar cun pequeo sea.  Si tiene entre 20 y 363aos, un mdico debe realizarle un examen clnico de las mamas como parte del examen regular de sCarroll[place] cada 1 a  3aos.  Si tiene 40aos o ms, debe realizarse un examen clnico de las mamas todos los aos. Tambin considere realizarse una radiografa de las mamas (mamografa) todos los aos.  Si tiene antecedentes familiares de cncer de mama, hable con su mdico para someterse a un estudio gentico.  Si  tiene alto riesgo de padecer cncer de mama, hable con su mdico para someterse a una resonancia magntica y una mamografa todos los aos.  La evaluacin del gen del cncer de mama (BRCA) se recomienda a mujeres que tengan familiares con cnceres relacionados con el BRCA. Los cnceres relacionados con el BRCA incluyen los siguientes: ? Mama. ? Ovario. ? Trompas. ? Cnceres de peritoneo.  Los resultados de la evaluacin determinarn la necesidad de asesoramiento gentico y de anlisis de BRCA1 y BRCA2. Cncer de cuello del tero El mdico puede recomendarle que se haga pruebas peridicas de deteccin de cncer de los rganos de la pelvis (ovarios, tero y vagina). Estas pruebas incluyen un examen plvico, que abarca controlar si se produjeron cambios microscpicos en la superficie del cuello del tero (prueba de Papanicolaou). Pueden recomendarle que se haga estas pruebas cada 3aos, a partir de los 21aos.  A las mujeres que tienen entre 30 y 65aos, los mdicos pueden recomendarles que se sometan a exmenes plvicos y pruebas de Papanicolaou cada 3aos, o a la prueba de Papanicolaou y el examen plvico en combinacin con estudios de deteccin del virus del papiloma humano (VPH) cada 5aos. Algunos tipos de VPH aumentan el riesgo de padecer cncer de cuello del tero. La prueba para la deteccin del VPH tambin puede realizarse a mujeres de cualquier edad cuyos resultados de la prueba de Papanicolaou no sean claros.  Es posible que otros mdicos no recomienden exmenes de deteccin a mujeres no embarazadas que se consideran sujetos de bajo riesgo de padecer cncer de pelvis y que no tienen sntomas. Pregntele al mdico si un examen plvico de deteccin es adecuado para usted.  Si ha recibido un tratamiento para el cncer cervical o una enfermedad que podra causar cncer, necesitar realizarse una prueba de Papanicolaou y controles durante al menos 20 aos de concluido el tratamiento. Si no se  ha hecho el Papanicolaou con regularidad, debern volver a evaluarse los factores de riesgo (como tener un nuevo compaero sexual), para determinar si debe realizarse los estudios nuevamente. Algunas mujeres sufren problemas mdicos que aumentan la probabilidad de contraer cncer de cuello del tero. En estos casos, el mdico podr indicar que se realicen controles y pruebas de Papanicolaou con ms frecuencia. Cncer colorrectal  Este tipo de cncer puede detectarse y a menudo prevenirse.  Por lo general, los estudios de rutina se deben comenzar a hacer a partir de los 50 aos y hasta los 75 aos.  Sin embargo, el mdico podr aconsejarle que lo haga antes, si tiene factores de riesgo para el cncer de colon.  Tambin puede recomendarle que use un kit de prueba para hallar sangre oculta en la materia fecal.  Es posible que se use una pequea cmara en el extremo de un tubo para examinar directamente el colon (sigmoidoscopia o colonoscopia) a fin de detectar formas tempranas de cncer colorrectal.  Los exmenes de rutina generalmente comienzan a los 50aos.  El examen directo del colon se debe repetir cada 5 a 10aos hasta los 75aos. Sin embargo, es posible que se realicen exmenes con mayor frecuencia, si se detectan formas tempranas de plipos precancerosos o pequeos bultos. Cncer de piel  Revise la piel   de la cabeza a los pies con regularidad.  Informe a su mdico si aparecen nuevos lunares o los que tiene se modifican, especialmente en su forma y color.  Tambin notifique al mdico si tiene un lunar que es ms grande que el tamao de una goma de lpiz.  Siempre use pantalla solar. Aplique pantalla solar de manera libre y repetida a lo largo del da.  Protjase usando mangas y pantalones largos, un sombrero de ala ancha y gafas para el sol, siempre que se encuentre en el exterior. ENFERMEDADES CARDACAS, DIABETES E HIPERTENSIN ARTERIAL  La hipertensin arterial causa  enfermedades cardacas y aumenta el riesgo de ictus. La hipertensin arterial es ms probable en los siguientes casos: ? Las personas que tienen la presin arterial en el extremo del rango normal (100-139/85-89 mm Hg). ? Las personas con sobrepeso u obesidad. ? Las personas afroamericanas.  Si usted tiene entre 18 y 39 aos, debe medirse la presin arterial cada 3 a 5 aos. Si usted tiene 40 aos o ms, debe medirse la presin arterial todos los aos. Debe medirse la presin arterial dos veces: una vez cuando est en un hospital o una clnica y la otra vez cuando est en otro sitio. Registre el promedio de las dos mediciones. Para controlar su presin arterial cuando no est en un hospital o una clnica, puede usar lo siguiente: ? Una mquina automtica para medir la presin arterial en una farmacia. ? Un monitor para medir la presin arterial en el hogar.  Si tiene entre 55 y 79 aos, consulte a su mdico si debe tomar aspirina para prevenir el ictus.  Realcese exmenes de deteccin de la diabetes con regularidad. Esto incluye la toma de una muestra de sangre para controlar el nivel de azcar en la sangre durante el ayuno. ? Si tiene un peso normal y un bajo riesgo de padecer diabetes, realcese este anlisis cada tres aos despus de los 45aos. ? Si tiene sobrepeso y un alto riesgo de padecer diabetes, considere someterse a este anlisis antes o con mayor frecuencia. PREVENCIN DE INFECCIONES HepatitisB  Si tiene un riesgo ms alto de contraer hepatitis B, debe someterse a un examen de deteccin de este virus. Se considera que tiene un alto riesgo de contraer hepatitis B si: ? Naci en un pas donde la hepatitis B es frecuente. Pregntele a su mdico qu pases son considerados de alto riesgo. ? Sus padres nacieron en un pas de alto riesgo y usted no recibi una vacuna que lo proteja contra la hepatitis B (vacuna contra la hepatitis B). ? Tiene VIH o sida. ? Usa agujas para inyectarse  drogas. ? Vive con alguien que tiene hepatitis B. ? Ha tenido sexo con alguien que tiene hepatitis B. ? Recibe tratamiento de hemodilisis. ? Toma ciertos medicamentos para el cncer, trasplante de rganos y afecciones autoinmunitarias. Hepatitis C  Se recomienda un anlisis de sangre para: ? Todos los que nacieron entre 1945 y 1965. ? Todas las personas que tengan un riesgo de haber contrado hepatitis C. Enfermedades de transmisin sexual (ETS).  Debe realizarse pruebas de deteccin de enfermedades de transmisin sexual (ETS), incluidas gonorrea y clamidia si: ? Es sexualmente activo y es menor de 24aos. ? Es mayor de 24aos, y el mdico le informa que corre riesgo de tener este tipo de infecciones. ? La actividad sexual ha cambiado desde que le hicieron la ltima prueba de deteccin y tiene un riesgo mayor de tener clamidia o gonorrea. Pregntele al mdico si usted   tiene riesgo.  Si no tiene el VIH, pero corre riesgo de infectarse por el virus, se recomienda tomar diariamente un medicamento recetado para evitar la infeccin. Esto se conoce como profilaxis previa a la exposicin. Se considera que est en riesgo si: ? Es [place] sexualmente y no [place] preservativos habitualmente o no conoce el estado del VIH de sus Advertising copywriter. ? Se inyecta drogas. ? Es [place] sexualmente con Ardelia Mems pareja que tiene VIH. Consulte a su mdico para saber si tiene un alto riesgo de infectarse por el VIH. Si opta por comenzar la profilaxis previa a la exposicin, primero debe realizarse anlisis de deteccin del VIH. Luego, le harn anlisis cada 34mses mientras est tomando los medicamentos para la profilaxis previa a la exposicin. [facility] Si es premenopusica y puede quedar eH[place] solicite a su mdico asesoramiento previo a la concepcin.  Si puede quedar embarazada, tome 400 a [id number]([id number]) de cido fAnheuser-Busch  Si desea evitar el embarazo, hable con su mdico sobre el  control de la natalidad (anticoncepcin). OSTEOPOROSIS Y MENOPAUSIA  La osteoporosis es una enfermedad en la que los huesos pierden los minerales y la fuerza por el avance de la edad. El resultado pueden ser fracturas graves en los hSaybrook El riesgo de osteoporosis puede identificarse con uArdelia Memsprueba de densidad sea.  Si tiene 65aos o ms, o si est en riesgo de sufrir osteoporosis y fracturas, pregunte a su mdico si debe someterse a exmenes.  Consulte a su mdico si debe tomar un suplemento de calcio o de vitamina D para reducir el riesgo de osteoporosis.  La menopausia puede presentar ciertos sntomas fsicos y rGaffer  La terapia de reemplazo hormonal puede reducir algunos de estos sntomas y rGaffer Consulte a su mdico para saber si la terapia de reemplazo hormonal es conveniente para usted. INSTRUCCIONES PARA EL CUIDADO EN EL HOGAR  Realcese los estudios de rutina de la salud, dentales y de lPublic librarian  MBath  No consuma ningn producto que contenga tabaco, lo que incluye cigarrillos, tabaco de mHigher education careers advisero cPsychologist, sport and exercise  Si est embarazada, no beba alcohol.  Si est amamantando, reduzca el consumo de alcohol y la frecuencia con la que consume.  Si es mujer y no est embarazada limite el consumo de alcohol a no ms de 1 medida por da. Una medida equivale a 12onzas de cerveza, 5onzas de vino o 1onzas de bebidas alcohlicas de alta graduacin.  No consuma drogas.  No comparta agujas.  Solicite ayuda a su mdico si necesita apoyo o informacin para abandonar las drogas.  Informe a su mdico si a menudo se siente deprimido.  Notifique a su mdico si alguna vez ha sido vctima de abuso o si no se siente seguro en su hogar. Esta informacin no tiene cMarine scientistel consejo del mdico. Asegrese de hacerle al mdico cualquier pregunta que tenga. Document Released: 10/19/2011 Document Revised: 11/20/2014 Document Reviewed:  08/03/2015 Elsevier Interactive Patient Education  2[employer]

## 2018-10-14 NOTE — Progress Notes (Signed)
Subjective:     Tracie Schroeder is a 52 y.o. female and is here for a comprehensive physical exam. The patient reports no problems.  Says the claritin isn't working well. She would like to try Singulair.  Is gets a lot of congestion and has a lot of sensitivity to smells and perfumes.  She would also like to be evaluated for arthritis.  She is had a lot of pain stiffness and swelling in her joints including her hands shoulders and hips.  She does have a mother who had psoriasis but no other autoimmune diseases in the family besides that.  She says she will usually have about 30 minutes of stiffness in the morning and will often wake up with her hands swollen.  She is also been having night sweats and would like to have her hormones tested.  Social History   Socioeconomic History  . Marital status: Married    Spouse name: Not on file  . Number of children: 2   . Years of education: Not on file  . Highest education level: Not on file  Occupational History  . 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 Financial resource strain: Not on file  . Food insecurity:    Worry: Not on file    Inability: Not on file  . Transportation needs:    Medical: Not on file    Non-medical: Not on file  Tobacco Use  . Smoking status: Former Smoker    Last attempt to quit: 11/14/1991    Years since quitting: 26.9  . Smokeless tobacco: Never Used  Substance and Sexual Activity  . Alcohol use: Yes    Alcohol/week: 2.0 standard drinks    Types: 2 Standard drinks or equivalent per week    Comment: per week  . Drug use: No  . Sexual activity: Yes    Partners: Male    Comment: physician, married, on [employer].  Lifestyle  . Physical activity:    Days per week: Not on file    Minutes per session: Not on file  . Stress: Not on file  Relationships  . Social connections:    Talks on phone: Not on file    Gets together: Not on file    Attends religious service: Not on file     Active member of club or organization: Not on file    Attends meetings of clubs or organizations: Not on file    Relationship status: Not on file  . Intimate partner violence:    Fear of current or ex partner: Not on file    Emotionally abused: Not on file    Physically abused: Not on file    Forced sexual activity: Not on file  Other Topics Concern  . Not on file  Social History Narrative   Has a son and daughter.  Some exercise.     Health Maintenance  Topic Date Due  . MAMMOGRAM  11/07/2018  . PAP SMEAR  08/23/2020  . TETANUS/TDAP  09/06/2021  . Fecal DNA (Cologuard)  10/03/2021  . INFLUENZA VACCINE  Completed  . Hepatitis C Screening  Completed  . HIV Screening  Completed    The following portions of the patient's history were reviewed and updated as appropriate: allergies, current medications, past family history, past medical history, past social history, past surgical history and problem list.  Review of Systems A comprehensive review of systems was negative.   Objective:  BP 133/72   Pulse 96   Ht 5' 3.39" (1.61 m)   Wt 142 lb (64.4 kg)   SpO2 100%   BMI 24.85 kg/m  General appearance: alert, cooperative and appears stated age Head: Normocephalic, without obvious abnormality, atraumatic Eyes: conj clear, EOMI, PEERLA Ears: normal TM's and external ear canals both ears Nose: Nares normal. Septum midline. Mucosa normal. No drainage or sinus tenderness. Throat: lips, mucosa, and tongue normal; teeth and gums normal Neck: no adenopathy, no carotid bruit, no JVD, supple, symmetrical, trachea midline and thyroid not enlarged, symmetric, no tenderness/mass/nodules Back: symmetric, no curvature. ROM normal. No CVA tenderness. Lungs: clear to auscultation bilaterally Heart: regular rate and rhythm, S1, S2 normal, no murmur, click, rub or gallop Abdomen: soft, non-tender; bowel sounds normal; no masses,  no organomegaly Extremities: extremities normal, atraumatic,  no cyanosis or edema Pulses: 2+ and symmetric Skin: Skin color, texture, turgor normal. No rashes or lesions Lymph nodes: Cervical, supraclavicular, and axillary nodes normal. Neurologic: Alert and oriented X 3, normal strength and tone. Normal symmetric reflexes. Normal coordination and gait    Assessment:    Healthy female exam.      Plan:     See After Visit Summary for Counseling Recommendations   Keep up a regular exercise program and make sure you are eating a healthy diet Try to eat 4 servings of dairy a day, or if you are lactose intolerant take a calcium with vitamin D daily.  Your vaccines are up to date.   Night sweats-we will check hormones to see if it looks like she is postmenopausal.  She has had a uterine ablation so does not have a menstrual cycle.  Polyarthralgia-we will do rheumatoid work-up.  If negative then likely osteoarthritis.

## 2018-10-15 DIAGNOSIS — Z832 Family history of diseases of the blood and blood-forming organs and certain disorders involving the immune mechanism: Secondary | ICD-10-CM | POA: Diagnosis not present

## 2018-10-15 DIAGNOSIS — M2550 Pain in unspecified joint: Secondary | ICD-10-CM | POA: Diagnosis not present

## 2018-10-15 DIAGNOSIS — R61 Generalized hyperhidrosis: Secondary | ICD-10-CM | POA: Diagnosis not present

## 2018-10-15 DIAGNOSIS — Z0000 Encounter for general adult medical examination without abnormal findings: Secondary | ICD-10-CM | POA: Diagnosis not present

## 2018-10-16 ENCOUNTER — Other Ambulatory Visit: Payer: Self-pay | Admitting: Family Medicine

## 2018-10-16 DIAGNOSIS — Z1239 Encounter for other screening for malignant neoplasm of breast: Secondary | ICD-10-CM

## 2018-10-17 LAB — RHEUMATOID FACTOR

## 2018-10-17 LAB — LUTEINIZING HORMONE: LH: 57.2 m[IU]/mL

## 2018-10-17 LAB — ANTI-NUCLEAR AB-TITER (ANA TITER): ANA Titer 1: 1:80 {titer} — ABNORMAL HIGH

## 2018-10-17 LAB — CBC
HEMATOCRIT: 40.3 % (ref 35.0–45.0)
HEMOGLOBIN: 13.7 g/dL (ref 11.7–15.5)
MCH: 31.7 pg (ref 27.0–33.0)
MCHC: 34 g/dL (ref 32.0–36.0)
MCV: 93.3 fL (ref 80.0–100.0)
MPV: 10 fL (ref 7.5–12.5)
Platelets: 375 10*3/uL (ref 140–400)
RBC: 4.32 10*6/uL (ref 3.80–5.10)
RDW: 11.4 % (ref 11.0–15.0)
WBC: 6.6 10*3/uL (ref 3.8–10.8)

## 2018-10-17 LAB — ESTRADIOL: Estradiol: 18 pg/mL

## 2018-10-17 LAB — COMPLETE METABOLIC PANEL WITH GFR
AG RATIO: 1.9 (calc) (ref 1.0–2.5)
ALT: 16 U/L (ref 6–29)
AST: 19 U/L (ref 10–35)
Albumin: 4.3 g/dL (ref 3.6–5.1)
Alkaline phosphatase (APISO): 63 U/L (ref 33–130)
BUN: 17 mg/dL (ref 7–25)
CALCIUM: 9.8 mg/dL (ref 8.6–10.4)
CHLORIDE: 104 mmol/L (ref 98–110)
CO2: 30 mmol/L (ref 20–32)
Creat: 0.81 mg/dL (ref 0.50–1.05)
GFR, EST NON AFRICAN AMERICAN: 82 mL/min/{1.73_m2} (ref >=60)
GFR, Est African American: 95 mL/min/{1.73_m2} (ref >=60)
GLUCOSE: 94 mg/dL (ref 65–99)
Globulin: 2.3 g/dL (calc) (ref 1.9–3.7)
POTASSIUM: 4 mmol/L (ref 3.5–5.3)
Sodium: 140 mmol/L (ref 135–146)
TOTAL PROTEIN: 6.6 g/dL (ref 6.1–8.1)
Total Bilirubin: 1.2 mg/dL (ref 0.2–1.2)

## 2018-10-17 LAB — LIPID PANEL
Cholesterol: 198 mg/dL (ref <200)
HDL: 93 mg/dL (ref >50)
LDL CHOLESTEROL (CALC): 91 mg/dL
NON-HDL CHOLESTEROL (CALC): 105 mg/dL (ref <130)
TRIGLYCERIDES: 59 mg/dL (ref <150)
Total CHOL/HDL Ratio: 2.1 (calc) (ref <5.0)

## 2018-10-17 LAB — PROGESTERONE: Progesterone: <0.5 ng/mL

## 2018-10-17 LAB — RPR: RPR: NONREACTIVE

## 2018-10-17 LAB — ANA: Anti Nuclear Antibody(ANA): POSITIVE — AB

## 2018-10-17 LAB — FOLLICLE STIMULATING HORMONE: FSH: 133.3 m[IU]/mL — ABNORMAL HIGH

## 2018-10-17 LAB — SEDIMENTATION RATE: Sed Rate: 9 mm/h (ref 0–30)

## 2018-10-17 LAB — CYCLIC CITRUL PEPTIDE ANTIBODY, IGG

## 2018-10-17 LAB — TSH: TSH: 1.06 mIU/L

## 2018-11-21 ENCOUNTER — Ambulatory Visit (INDEPENDENT_AMBULATORY_CARE_PROVIDER_SITE_OTHER): Payer: Federal, State, Local not specified - PPO

## 2018-11-21 DIAGNOSIS — Z1231 Encounter for screening mammogram for malignant neoplasm of breast: Secondary | ICD-10-CM | POA: Diagnosis not present

## 2018-11-21 DIAGNOSIS — K080 Exfoliation of teeth due to systemic causes: Secondary | ICD-10-CM | POA: Diagnosis not present

## 2018-11-21 DIAGNOSIS — Z1239 Encounter for other screening for malignant neoplasm of breast: Secondary | ICD-10-CM | POA: Diagnosis not present

## 2019-02-26 ENCOUNTER — Other Ambulatory Visit: Payer: Self-pay | Admitting: *Deleted

## 2019-02-26 DIAGNOSIS — M545 Low back pain, unspecified: Secondary | ICD-10-CM

## 2019-02-26 DIAGNOSIS — G8929 Other chronic pain: Secondary | ICD-10-CM

## 2019-02-26 MED ORDER — CYCLOBENZAPRINE HCL 10 MG PO TABS: 5.0000 mg | ORAL_TABLET | Freq: Every evening | ORAL | 2 refills | Status: DC | PRN

## 2019-07-16 ENCOUNTER — Encounter: Payer: Self-pay | Admitting: Family Medicine

## 2019-07-16 MED ORDER — SOOLANTRA 1 % EX CREA: 1.0000 "application " | TOPICAL_CREAM | Freq: Every day | CUTANEOUS | 99 refills | Status: DC

## 2019-07-29 ENCOUNTER — Telehealth: Payer: Self-pay

## 2019-07-29 NOTE — Telephone Encounter (Signed)
Please call the patient and see what she would like to do.  If she is okay with brand then she can even look online to see if there is any coupons for the brand.

## 2019-07-29 NOTE — Telephone Encounter (Signed)
Tracie Schroeder from [facility] in [employer] called requesting alternate RX be called in to replace the Soolantra 1% cream because the generic is unavailable at this time

## 2019-07-31 ENCOUNTER — Encounter: Payer: Self-pay | Admitting: Family Medicine

## 2019-07-31 MED ORDER — RHOFADE 1 % EX CREA: 1.0000 "application " | TOPICAL_CREAM | Freq: Every day | CUTANEOUS | 2 refills | Status: DC

## 2019-07-31 NOTE — Telephone Encounter (Signed)
Barnet Pall,   Do you think you could help with this? Do we have savings cards here? Do you know anything about this being unavailable?   Thanks in advance!!

## 2019-07-31 NOTE — Telephone Encounter (Signed)
There is not substitute and she has already tried several other meds for Rosacea so unfortunately will have to try to get in touch with her and let her know what is going on.  I sent over a new script for Rhofade. I don't think she has tried that one.

## 2019-07-31 NOTE — Telephone Encounter (Signed)
I called [facility] and spoke with Nunzio Cory. She states that no pharmacies can get this cream at all. It does not need a savings card but it is not able to be ordered from anywhere at this time. She will have to use something else since this is not a available medication. I have tried to call the patient and it rings and rings with no VM. Can we just send something else? Please advise.

## 2019-08-01 ENCOUNTER — Encounter: Payer: Self-pay | Admitting: Family Medicine

## 2019-08-01 NOTE — Telephone Encounter (Signed)
Pt advised via MyChart. 

## 2019-08-25 NOTE — Telephone Encounter (Signed)
The Prior Authorization request has been approved for Rhofade 1% EX CREA. The authorization is valid from 07/26/2019 through 02/21/2020. Pharmacy aware.

## 2019-10-23 ENCOUNTER — Other Ambulatory Visit: Payer: Self-pay | Admitting: Family Medicine

## 2020-01-13 ENCOUNTER — Other Ambulatory Visit: Payer: Self-pay | Admitting: Family Medicine

## 2020-01-13 DIAGNOSIS — Z1231 Encounter for screening mammogram for malignant neoplasm of breast: Secondary | ICD-10-CM

## 2020-01-29 ENCOUNTER — Other Ambulatory Visit: Payer: Self-pay

## 2020-01-29 ENCOUNTER — Ambulatory Visit (INDEPENDENT_AMBULATORY_CARE_PROVIDER_SITE_OTHER): Payer: Federal, State, Local not specified - PPO

## 2020-01-29 DIAGNOSIS — Z1231 Encounter for screening mammogram for malignant neoplasm of breast: Secondary | ICD-10-CM | POA: Diagnosis not present

## 2020-08-10 ENCOUNTER — Ambulatory Visit (INDEPENDENT_AMBULATORY_CARE_PROVIDER_SITE_OTHER): Payer: Federal, State, Local not specified - PPO | Admitting: Family Medicine

## 2020-08-10 ENCOUNTER — Encounter: Payer: Self-pay | Admitting: Family Medicine

## 2020-08-10 VITALS — BP 139/82 | HR 82 | Ht 63.0 in | Wt 118.0 lb

## 2020-08-10 DIAGNOSIS — Z0000 Encounter for general adult medical examination without abnormal findings: Secondary | ICD-10-CM

## 2020-08-10 DIAGNOSIS — M545 Low back pain, unspecified: Secondary | ICD-10-CM

## 2020-08-10 DIAGNOSIS — Z23 Encounter for immunization: Secondary | ICD-10-CM

## 2020-08-10 DIAGNOSIS — N63 Unspecified lump in unspecified breast: Secondary | ICD-10-CM | POA: Diagnosis not present

## 2020-08-10 DIAGNOSIS — L659 Nonscarring hair loss, unspecified: Secondary | ICD-10-CM

## 2020-08-10 DIAGNOSIS — G8929 Other chronic pain: Secondary | ICD-10-CM

## 2020-08-10 MED ORDER — MELOXICAM 15 MG PO TABS: ORAL_TABLET | ORAL | 1 refills | Status: AC

## 2020-08-10 MED ORDER — CYCLOBENZAPRINE HCL 10 MG PO TABS: 5.0000 mg | ORAL_TABLET | Freq: Every evening | ORAL | 1 refills | Status: AC | PRN

## 2020-08-10 NOTE — Patient Instructions (Signed)
Health Maintenance, Female Adopting a healthy lifestyle and getting preventive care are important in promoting health and wellness. Ask your health care provider about:  The right schedule for you to have regular tests and exams.  Things you can do on your own to prevent diseases and keep yourself healthy. What should I know about diet, weight, and exercise? Eat a healthy diet   Eat a diet that includes plenty of vegetables, fruits, low-fat dairy products, and lean protein.  Do not eat a lot of foods that are high in solid fats, added sugars, or sodium. Maintain a healthy weight Body mass index (BMI) is used to identify weight problems. It estimates body fat based on height and weight. Your health care provider can help determine your BMI and help you achieve or maintain a healthy weight. Get regular exercise Get regular exercise. This is one of the most important things you can do for your health. Most adults should:  Exercise for at least 150 minutes each week. The exercise should increase your heart rate and make you sweat (moderate-intensity exercise).  Do strengthening exercises at least twice a week. This is in addition to the moderate-intensity exercise.  Spend less time sitting. Even light physical activity can be beneficial. Watch cholesterol and blood lipids Have your blood tested for lipids and cholesterol at 56 years of age, then have this test every 5 years. Have your cholesterol levels checked more often if:  Your lipid or cholesterol levels are high.  You are older than 56 years of age.  You are at high risk for heart disease. What should I know about cancer screening? Depending on your health history and family history, you may need to have cancer screening at various ages. This may include screening for:  Breast cancer.  Cervical cancer.  Colorectal cancer.  Skin cancer.  Lung cancer. What should I know about heart disease, diabetes, and high blood  pressure? Blood pressure and heart disease  High blood pressure causes heart disease and increases the risk of stroke. This is more likely to develop in people who have high blood pressure readings, are of African descent, or are overweight.  Have your blood pressure checked: ? Every 3-5 years if you are 18-39 years of age. ? Every year if you are 40 years old or older. Diabetes Have regular diabetes screenings. This checks your fasting blood sugar level. Have the screening done:  Once every three years after age 40 if you are at a normal weight and have a low risk for diabetes.  More often and at a younger age if you are overweight or have a high risk for diabetes. What should I know about preventing infection? Hepatitis B If you have a higher risk for hepatitis B, you should be screened for this virus. Talk with your health care provider to find out if you are at risk for hepatitis B infection. Hepatitis C Testing is recommended for:  Everyone born from 1945 through 1965.  Anyone with known risk factors for hepatitis C. Sexually transmitted infections (STIs)  Get screened for STIs, including gonorrhea and chlamydia, if: ? You are sexually active and are younger than 56 years of age. ? You are older than 56 years of age and your health care provider tells you that you are at risk for this type of infection. ? Your sexual activity has changed since you were last screened, and you are at increased risk for chlamydia or gonorrhea. Ask your health care provider if   you are at risk.  Ask your health care provider about whether you are at high risk for HIV. Your health care provider may recommend a prescription medicine to help prevent HIV infection. If you choose to take medicine to prevent HIV, you should first get tested for HIV. You should then be tested every 3 months for as long as you are taking the medicine. Pregnancy  If you are about to stop having your period (premenopausal) and  you may become pregnant, seek counseling before you get pregnant.  Take 400 to 800 micrograms (mcg) of folic acid every day if you become pregnant.  Ask for birth control (contraception) if you want to prevent pregnancy. Osteoporosis and menopause Osteoporosis is a disease in which the bones lose minerals and strength with aging. This can result in bone fractures. If you are 65 years old or older, or if you are at risk for osteoporosis and fractures, ask your health care provider if you should:  Be screened for bone loss.  Take a calcium or vitamin D supplement to lower your risk of fractures.  Be given hormone replacement therapy (HRT) to treat symptoms of menopause. Follow these instructions at home: Lifestyle  Do not use any products that contain nicotine or tobacco, such as cigarettes, e-cigarettes, and chewing tobacco. If you need help quitting, ask your health care provider.  Do not use street drugs.  Do not share needles.  Ask your health care provider for help if you need support or information about quitting drugs. Alcohol use  Do not drink alcohol if: ? Your health care provider tells you not to drink. ? You are pregnant, may be pregnant, or are planning to become pregnant.  If you drink alcohol: ? Limit how much you use to 0-1 drink a day. ? Limit intake if you are breastfeeding.  Be aware of how much alcohol is in your drink. In the [place] one drink equals one 12 oz bottle of beer (355 mL), one 5 oz glass of wine (148 mL), or one 1 oz glass of hard liquor (44 mL). General instructions  Schedule regular health, dental, and eye exams.  Stay current with your vaccines.  Tell your health care provider if: ? You often feel depressed. ? You have ever been abused or do not feel safe at home. Summary  Adopting a healthy lifestyle and getting preventive care are important in promoting health and wellness.  Follow your health care provider's instructions about healthy  diet, exercising, and getting tested or screened for diseases.  Follow your health care provider's instructions on monitoring your cholesterol and blood pressure. This information is not intended to replace advice given to you by your health care provider. Make sure you discuss any questions you have with your health care provider. Document Revised: 10/23/2018 Document Reviewed: 10/23/2018 Elsevier Patient Education  2020 Else[employer] Inc[employer]  

## 2020-08-10 NOTE — Progress Notes (Signed)
Subjective:     Tracie Schroeder is a 54 y.o. female and is here for a comprehensive physical exam. The patient reports no problems.  For all she has been doing well she has now moved to [place] but has been able to continue to work from home, which she has been overall happy with.  Since last January she has been working diligently to really get healthy she says she is lost about 26 pounds in total.  She has been using my fitness pal to help track calories.  She also added some supplements including, B12, folate, zinc, magnesium, vitamin C, calcium, vitamin B-1.  He also now eats a gluten-free diet.  He is also noticed a couple of nodules in her right breast over the last month.  She does have a history of fibrocystic breast tissue she is not noticed any drainage from the nipples.  Social History   Socioeconomic History  . Marital status: Married    Spouse name: Not on file  . Number of children: 2   . Years of education: Not on file  . Highest education level: Not on file  Occupational History  . 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 Smoking status: Former Smoker    Quit date: 11/14/1991    Years since quitting: 28.7  . Smokeless tobacco: Never Used  Substance and Sexual Activity  . Alcohol use: Yes    Alcohol/week: 2.0 standard drinks    Types: 2 Standard drinks or equivalent per week    Comment: per week  . Drug use: No  . Sexual activity: Yes    Partners: Male    Comment: physician, married, on [employer].  Other Topics Concern  . Not on file  Social History Narrative   Has a son and daughter.  Some exercise.     Social Determinants of Health   Financial Resource Strain:   . Difficulty of Paying Living Expenses: Not on file  Food Insecurity:   . Worried About Charity fundraiser in the Last Year: Not on file  . Ran Out of Food in the Last Year: Not on file  Transportation Needs:   . Lack of Transportation (Medical): Not on file  .  Lack of Transportation (Non-Medical): Not on file  Physical Activity:   . Days of Exercise per Week: Not on file  . Minutes of Exercise per Session: Not on file  Stress:   . Feeling of Stress : Not on file  Social Connections:   . Frequency of Communication with Friends and Family: Not on file  . Frequency of Social Gatherings with Friends and Family: Not on file  . Attends Religious Services: Not on file  . Active Member of Clubs or Organizations: Not on file  . Attends Archivist Meetings: Not on file  . Marital Status: Not on file  Intimate Partner Violence:   . Fear of Current or Ex-Partner: Not on file  . Emotionally Abused: Not on file  . Physically Abused: Not on file  . Sexually Abused: Not on file   Health Maintenance  Topic Date Due  . COVID-19 Vaccine (1) Never done  . PAP SMEAR-Modifier  08/23/2020  . MAMMOGRAM  01/28/2021  . TETANUS/TDAP  09/06/2021  . Fecal DNA (Cologuard)  10/03/2021  . INFLUENZA VACCINE  Completed  . Hepatitis C Screening  Completed  . HIV Screening  Completed    The following portions of the patient's  history were reviewed and updated as appropriate: allergies, current medications, past family history, past medical history, past social history, past surgical history and problem list.  Review of Systems A comprehensive review of systems was negative.   Objective:    BP 139/82   Pulse 82   Ht 5\' 3"  (1.6 m)   Wt 118 lb (53.5 kg)   SpO2 98%   BMI 20.90 kg/m  General appearance: alert, cooperative and appears stated age Head: Normocephalic, without obvious abnormality, atraumatic Eyes: conj clear, EOMI, PEERLA Ears: normal TM's and external ear canals both ears Nose: Nares normal. Septum midline. Mucosa normal. No drainage or sinus tenderness. Throat: lips, mucosa, and tongue normal; teeth and gums normal Neck: no adenopathy, no carotid bruit, no JVD, supple, symmetrical, trachea midline and thyroid not enlarged, symmetric, no  tenderness/mass/nodules Back: symmetric, no curvature. ROM normal. No CVA tenderness. Lungs: clear to auscultation bilaterally Heart: regular rate and rhythm, S1, S2 normal, no murmur, click, rub or gallop  Breast: Right breast with several palpable abnormalities.  In the left lower inner quadrant just over the sternum there is an approximately 1 cm round smooth lesion that is somewhat mobile.  There is a 1/2 cm lesion in the middle lower quadrant just at the edge of the sternum.  Also in the inner lower quadrant there is round lesion at the 5 o'clock position about 1/2 inches from the edge of the areola there is a 2 x 2 cm lesion.  There is also 0.5 cm lesion towards the right axilla and upper outer quadrant. Abdomen: soft, non-tender; bowel sounds normal; no masses,  no organomegaly Extremities: extremities normal, atraumatic, no cyanosis or edema Pulses: 2+ and symmetric Skin: Skin color, texture, turgor normal. No rashes or lesions Lymph nodes: Cervical adenopathy: nl and Axillary adenopathy: nl Neurologic: Alert and oriented X 3, normal strength and tone. Normal symmetric reflexes. Normal coordination and gait    Assessment:    Healthy female exam.      Plan:     See After Visit Summary for Counseling Recommendations   Keep up a regular exercise program and make sure you are eating a healthy diet Try to eat 4 servings of dairy a day, or if you are lactose intolerant take a calcium with vitamin D daily.  Your vaccines are up to date.  Declines Pap smear today she is technically due next month and will plan to do it later.  Breast lesions on the right-most likely cysts as they are fairly smooth but I am concerned that I would like to get a diagnostic mammogram and ultrasound for further work-up.  Her last mammogram was March 2021.  She is also lost a significant amount of weight so that may actually change the architecture of her mammogram.

## 2020-08-12 ENCOUNTER — Other Ambulatory Visit: Payer: Self-pay | Admitting: *Deleted

## 2020-08-12 DIAGNOSIS — N63 Unspecified lump in unspecified breast: Secondary | ICD-10-CM

## 2020-08-18 DIAGNOSIS — L659 Nonscarring hair loss, unspecified: Secondary | ICD-10-CM | POA: Diagnosis not present

## 2020-08-18 DIAGNOSIS — Z0000 Encounter for general adult medical examination without abnormal findings: Secondary | ICD-10-CM | POA: Diagnosis not present

## 2020-08-18 DIAGNOSIS — K080 Exfoliation of teeth due to systemic causes: Secondary | ICD-10-CM | POA: Diagnosis not present

## 2020-08-19 LAB — COMPLETE METABOLIC PANEL WITH GFR
AG Ratio: 1.8 (calc) (ref 1.0–2.5)
ALT: 18 U/L (ref 6–29)
AST: 22 U/L (ref 10–35)
Albumin: 4.5 g/dL (ref 3.6–5.1)
Alkaline phosphatase (APISO): 62 U/L (ref 37–153)
BUN: 17 mg/dL (ref 7–25)
CO2: 31 mmol/L (ref 20–32)
Calcium: 10.2 mg/dL (ref 8.6–10.4)
Chloride: 101 mmol/L (ref 98–110)
Creat: 0.85 mg/dL (ref 0.50–1.05)
GFR, Est African American: 89 mL/min/{1.73_m2} (ref >=60)
GFR, Est Non African American: 77 mL/min/{1.73_m2} (ref >=60)
Globulin: 2.5 g/dL (calc) (ref 1.9–3.7)
Glucose, Bld: 99 mg/dL (ref 65–99)
Potassium: 4.2 mmol/L (ref 3.5–5.3)
Sodium: 140 mmol/L (ref 135–146)
Total Bilirubin: 0.7 mg/dL (ref 0.2–1.2)
Total Protein: 7 g/dL (ref 6.1–8.1)

## 2020-08-19 LAB — CBC
HCT: 40.6 % (ref 35.0–45.0)
Hemoglobin: 13.6 g/dL (ref 11.7–15.5)
MCH: 31.6 pg (ref 27.0–33.0)
MCHC: 33.5 g/dL (ref 32.0–36.0)
MCV: 94.2 fL (ref 80.0–100.0)
MPV: 10.1 fL (ref 7.5–12.5)
Platelets: 403 10*3/uL — ABNORMAL HIGH (ref 140–400)
RBC: 4.31 10*6/uL (ref 3.80–5.10)
RDW: 11.5 % (ref 11.0–15.0)
WBC: 5.6 10*3/uL (ref 3.8–10.8)

## 2020-08-19 LAB — LIPID PANEL
Cholesterol: 185 mg/dL (ref <200)
HDL: 78 mg/dL (ref >=50)
LDL Cholesterol (Calc): 91 mg/dL (calc)
Non-HDL Cholesterol (Calc): 107 mg/dL (calc) (ref <130)
Total CHOL/HDL Ratio: 2.4 (calc) (ref <5.0)
Triglycerides: 70 mg/dL (ref <150)

## 2020-08-19 LAB — VITAMIN B12: Vitamin B-12: >2000 pg/mL — ABNORMAL HIGH (ref 200–1100)

## 2020-08-19 LAB — TSH: TSH: 1.33 mIU/L

## 2020-08-31 DIAGNOSIS — R921 Mammographic calcification found on diagnostic imaging of breast: Secondary | ICD-10-CM | POA: Diagnosis not present

## 2020-08-31 DIAGNOSIS — N6314 Unspecified lump in the right breast, lower inner quadrant: Secondary | ICD-10-CM | POA: Diagnosis not present

## 2020-09-08 ENCOUNTER — Encounter: Payer: Self-pay | Admitting: Family Medicine

## 2020-09-08 DIAGNOSIS — N6314 Unspecified lump in the right breast, lower inner quadrant: Secondary | ICD-10-CM | POA: Diagnosis not present

## 2020-09-08 DIAGNOSIS — C50911 Malignant neoplasm of unspecified site of right female breast: Secondary | ICD-10-CM | POA: Diagnosis not present

## 2020-09-08 DIAGNOSIS — N63 Unspecified lump in unspecified breast: Secondary | ICD-10-CM | POA: Diagnosis not present

## 2020-09-08 DIAGNOSIS — N6315 Unspecified lump in the right breast, overlapping quadrants: Secondary | ICD-10-CM | POA: Diagnosis not present

## 2020-09-13 ENCOUNTER — Encounter: Payer: Self-pay | Admitting: Family Medicine

## 2020-09-14 ENCOUNTER — Ambulatory Visit: Payer: Federal, State, Local not specified - PPO | Admitting: Family Medicine

## 2020-09-14 ENCOUNTER — Encounter: Payer: Self-pay | Admitting: Family Medicine

## 2020-09-14 ENCOUNTER — Other Ambulatory Visit: Payer: Self-pay

## 2020-09-14 VITALS — BP 141/91 | HR 86 | Ht 63.0 in | Wt 119.0 lb

## 2020-09-14 DIAGNOSIS — R413 Other amnesia: Secondary | ICD-10-CM

## 2020-09-14 DIAGNOSIS — G4700 Insomnia, unspecified: Secondary | ICD-10-CM | POA: Diagnosis not present

## 2020-09-14 DIAGNOSIS — C801 Malignant (primary) neoplasm, unspecified: Secondary | ICD-10-CM | POA: Diagnosis not present

## 2020-09-14 DIAGNOSIS — H547 Unspecified visual loss: Secondary | ICD-10-CM

## 2020-09-14 MED ORDER — CLONAZEPAM 0.25 MG PO TBDP: 0.2500 mg | ORAL_TABLET | Freq: Every evening | ORAL | 1 refills | Status: DC | PRN

## 2020-09-14 NOTE — Assessment & Plan Note (Signed)
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 With recent diagnosis of breast cancer I am concerned about the possibility for brain lesion especially with normal eye exam back in the spring. We will get brain MRI with and without contrast if negative then consider referral to an ophthalmologist for further work-up.

## 2020-09-14 NOTE — Assessment & Plan Note (Signed)
Is also noted she is having a little bit more difficult time with word finding over the last couple of months. She is not been forgetting things per se or losing items but again just having difficulty remembering simple words like meatballs or generic names of medications.

## 2020-09-14 NOTE — Progress Notes (Addendum)
Established Patient Office Visit  Subjective:  Patient ID: Tracie Schroeder, female    DOB: 03-Aug-1966  Age: 54 y.o. MRN: [id number]  CC:  Chief Complaint  Patient presents with  . vision problems    my eye doctor- Darcey Nora     HPI Tracie Schroeder presents for   New onset visual change.  Says has had about 7 episodes of temporary vision loss, always in the left eye.  Feels like her vision is obscured.  No HA but has some pressure in her head. Last episodes was last night. Couldn't;t see out of her left eye around 2AM for about 5 minutes. No excess watering. In the past her episodes usually only last about 2 to 3 minutes.. Last eye exam was in the spring by her optometrist Lurlean Horns at [facility]. She did complain of this to her eye doctor who did an exam and reported that everything looked normal. She does not feel like the episodes are coming more frequently but does feel like the episode last night was different and that it lasted much longer.  Also having difficulty with sleep maintenance since recent diagnoses of invasive ductal carcinoma. Has already tried taking Melatonin and Suntheanine.these are not helping. Normally they work well. She would like to try something else that might be helpful for her.  She is still continuing to work. She has a breast MRI scheduled on Friday and then will follow up with the surgeon next week.  I saw her for her physical at the end of September and unfortunately palpated for masses in the right breast. Seen being that she had moved to [place] recently we got her imaging scheduled there and they she did undergo biopsies. It did confirm breast cancer she is scheduled for an MRI on Friday and follow-up with the surgeon next week.   Past Medical History:  Diagnosis Date  . Anemia   . Arthritis   . Axillary lump    RT  . Gluten intolerance   . Thyroid disease    multinodular goiter  . Thyroid nodule     Past Surgical History:  Procedure  Laterality Date  . HEMORRHOID SURGERY  12/2009  . TONSILLECTOMY    . TUBAL LIGATION    . uterine ablation      Family History  Problem Relation Age of Onset  . Hypertension Father   . Kidney disease Father        agenesis  . Diabetes Father   . Thyroid disease Mother   . Cancer Paternal Aunt        breast  . Diabetes Maternal Grandmother   . Diabetes Maternal Grandfather   . Heart attack Paternal Uncle   . Stroke Paternal Aunt   . Parkinsonism Paternal Aunt   . Thyroid disease Paternal Aunt     Social History   Socioeconomic History  . Marital status: Married    Spouse name: Not on file  . Number of children: 2   . Years of education: Not on file  . Highest education level: Not on file  Occupational History  . Occupation: pschiatrist    Comment: [employer] in [place]   Tobacco Use  . Smoking status: Former Smoker    Quit date: 11/14/1991    Years since quitting: 28.8  . Smokeless tobacco: Never Used  Substance and Sexual Activity  . Alcohol use: Yes    Alcohol/week: 2.0 standard drinks    Types: 2 Standard drinks or equivalent per  week    Comment: per week  . Drug use: No  . Sexual activity: Yes    Partners: Male    Comment: physician, married, on [employer].  Other Topics Concern  . Not on file  Social History Narrative   Has a son and daughter.  Some exercise.     Social Determinants of Health   Financial Resource Strain:   . Difficulty of Paying Living Expenses: Not on file  Food Insecurity:   . Worried About Charity fundraiser in the Last Year: Not on file  . Ran Out of Food in the Last Year: Not on file  Transportation Needs:   . Lack of Transportation (Medical): Not on file  . Lack of Transportation (Non-Medical): Not on file  Physical Activity:   . Days of Exercise per Week: Not on file  . Minutes of Exercise per Session: Not on file  Stress:   . Feeling of Stress : Not on file  Social Connections:   . Frequency of Communication with Friends  and Family: Not on file  . Frequency of Social Gatherings with Friends and Family: Not on file  . Attends Religious Services: Not on file  . Active Member of Clubs or Organizations: Not on file  . Attends Archivist Meetings: Not on file  . Marital Status: Not on file  Intimate Partner Violence:   . Fear of Current or Ex-Partner: Not on file  . Emotionally Abused: Not on file  . Physically Abused: Not on file  . Sexually Abused: Not on file    Outpatient Medications Prior to Visit  Medication Sig Dispense Refill  . ASHWAGANDHA PO Take by mouth.    Marland Kitchen CALCIUM-MAGNESIUM-ZINC PO Take by mouth.    . cyclobenzaprine (FLEXERIL) 10 MG tablet Take 0.5-1 tablets (5-10 mg total) by mouth at bedtime as needed for muscle spasms. 30 tablet 1  . EPINEPHRINE 0.3 mg/0.3 mL IJ SOAJ injection INJECT 0.3MLS (0.3MG  TOTAL) INTO THE MUSCLES AS NEEDED (ALLERGIC REACTON) 2 each PRN  . l-methylfolate-B6-B12 (METANX) 3-35-2 MG TABS tablet Take 1 tablet by mouth daily.    . Levomefolate Glucosamine (METHYLFOLATE) 400 MCG CAPS Take by mouth.    . MELATONIN PO Take by mouth.    . meloxicam (MOBIC) 15 MG tablet TAKE ONE TABLET BY MOUTH DAILY AS NEED FOR PAIN 90 tablet 1  . Omega-3 Fatty Acids (OMEGA 3 PO) Take by mouth.    Marland Kitchen OVER THE COUNTER MEDICATION [place]    . pantoprazole (PROTONIX) 40 MG tablet TAKE 1 TABLET BY MOUTH EVERY DAY 90 tablet 2  . Ubiquinol 100 MG CAPS Take 1 capsule by mouth daily.    Marland Kitchen VITAMIN D PO Take by mouth.     No facility-administered medications prior to visit.    Allergies  Allergen Reactions  . Adhesive [Tape] Itching  . Biaxin [Clarithromycin]   . Other     Almonds and hazelnuts    ROS Review of Systems    Objective:    Physical Exam Vitals reviewed.  Constitutional:      Appearance: She is well-developed.  HENT:     Head: Normocephalic and atraumatic.  Eyes:     Extraocular Movements: Extraocular movements intact.     Conjunctiva/sclera: Conjunctivae  normal.     Pupils: Pupils are equal, round, and reactive to light.  Cardiovascular:     Rate and Rhythm: Normal rate.  Pulmonary:     Effort: Pulmonary effort is normal.  Musculoskeletal:  Cervical back: Neck supple. No rigidity or tenderness.  Skin:    General: Skin is dry.     Coloration: Skin is not pale.  Neurological:     Mental Status: She is alert and oriented to person, place, and time.  Psychiatric:        Behavior: Behavior normal.     BP (!) 141/91   Pulse 86   Ht 5\' 3"  (1.6 m)   Wt 119 lb (54 kg)   SpO2 100%   BMI 21.08 kg/m  Wt Readings from Last 3 Encounters:  09/14/20 119 lb (54 kg)  08/10/20 118 lb (53.5 kg)  10/14/18 142 lb (64.4 kg)     There are no preventive care reminders to display for this patient.  There are no preventive care reminders to display for this patient.  Lab Results  Component Value Date   TSH 1.33 08/18/2020   Lab Results  Component Value Date   WBC 5.6 08/18/2020   HGB 13.6 08/18/2020   HCT 40.6 08/18/2020   MCV 94.2 08/18/2020   PLT 403 (H) 08/18/2020   Lab Results  Component Value Date   NA 140 08/18/2020   K 4.2 08/18/2020   CO2 31 08/18/2020   GLUCOSE 99 08/18/2020   BUN 17 08/18/2020   CREATININE 0.85 08/18/2020   BILITOT 0.7 08/18/2020   ALKPHOS 61 10/05/2017   AST 22 08/18/2020   ALT 18 08/18/2020   PROT 7.0 08/18/2020   ALBUMIN 4.3 10/05/2017   CALCIUM 10.2 08/18/2020   Lab Results  Component Value Date   CHOL 185 08/18/2020   Lab Results  Component Value Date   HDL 78 08/18/2020   Lab Results  Component Value Date   LDLCALC 91 08/18/2020   Lab Results  Component Value Date   TRIG 70 08/18/2020   Lab Results  Component Value Date   CHOLHDL 2.4 08/18/2020   No results found for: HGBA1C    Assessment & Plan:   Problem List Items Addressed This Visit      Other   Vision problems    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 With recent  diagnosis of breast cancer I am concerned about the possibility for brain lesion especially with normal eye exam back in the spring. We will get brain MRI with and without contrast if negative then consider referral to an ophthalmologist for further work-up.      Relevant Orders   MR Brain W Wo Contrast   Memory changes    Is also noted she is having a little bit more difficult time with word finding over the last couple of months. She is not been forgetting things per se or losing items but again just having difficulty remembering simple words like meatballs or generic names of medications.      Insomnia - Primary    most difficulty with sleep maintenance.  Discussed trial of as needed clonazepam to help with sleep hopefully for just a short period of time.  Normally her regular supplements work well for her.  She is aware to use sparingly.      Ductal carcinoma ([place])       Meds ordered this encounter  Medications  . clonazePAM (KLONOPIN) 0.25 MG disintegrating tablet    Sig: Take 1 tablet (0.25 mg total) by mouth at bedtime as needed for seizure.    Dispense:  30 tablet    Refill:  1  Follow-up: No follow-ups on file.    Beatrice Lecher, MD

## 2020-09-14 NOTE — Assessment & Plan Note (Signed)
most difficulty with sleep maintenance.  Discussed trial of as needed clonazepam to help with sleep hopefully for just a short period of time.  Normally her regular supplements work well for her.  She is aware to use sparingly.

## 2020-09-14 NOTE — Progress Notes (Signed)
Pt reports that she has been experiencing vision issues over the past year. She has had 7 of these episodes over the past year.  She stated that when she woke up around 2 am to go to the bathroom she was unable to see out of her L eye she could see light but nothing else. She denies any headaches/or pain in her eyes,no watering of her eyes. She does feel like she has pressure in her head.   Her last eye exam was in March 2021. She stated that she spoke to her doctor about this and he didn't notice any changes in her eyes.

## 2020-09-15 ENCOUNTER — Encounter: Payer: Self-pay | Admitting: Family Medicine

## 2020-09-15 MED ORDER — CLONAZEPAM 0.25 MG PO TBDP: 0.2500 mg | ORAL_TABLET | Freq: Every evening | ORAL | 1 refills | Status: AC | PRN

## 2020-09-17 ENCOUNTER — Encounter: Payer: Self-pay | Admitting: Family Medicine

## 2020-09-17 DIAGNOSIS — C50311 Malignant neoplasm of lower-inner quadrant of right female breast: Secondary | ICD-10-CM | POA: Diagnosis not present

## 2020-09-22 DIAGNOSIS — Z170 Estrogen receptor positive status [ER+]: Secondary | ICD-10-CM | POA: Diagnosis not present

## 2020-09-22 DIAGNOSIS — C50911 Malignant neoplasm of unspecified site of right female breast: Secondary | ICD-10-CM | POA: Diagnosis not present

## 2020-09-25 DIAGNOSIS — R42 Dizziness and giddiness: Secondary | ICD-10-CM | POA: Diagnosis not present

## 2020-09-25 DIAGNOSIS — H538 Other visual disturbances: Secondary | ICD-10-CM | POA: Diagnosis not present

## 2020-09-25 DIAGNOSIS — H547 Unspecified visual loss: Secondary | ICD-10-CM | POA: Diagnosis not present

## 2020-09-29 DIAGNOSIS — C50911 Malignant neoplasm of unspecified site of right female breast: Secondary | ICD-10-CM | POA: Diagnosis not present

## 2020-09-29 DIAGNOSIS — C50919 Malignant neoplasm of unspecified site of unspecified female breast: Secondary | ICD-10-CM | POA: Diagnosis not present

## 2020-09-30 DIAGNOSIS — C50911 Malignant neoplasm of unspecified site of right female breast: Secondary | ICD-10-CM | POA: Diagnosis not present

## 2020-09-30 DIAGNOSIS — Z421 Encounter for breast reconstruction following mastectomy: Secondary | ICD-10-CM | POA: Diagnosis not present

## 2020-10-12 DIAGNOSIS — C50311 Malignant neoplasm of lower-inner quadrant of right female breast: Secondary | ICD-10-CM | POA: Diagnosis not present

## 2020-10-12 DIAGNOSIS — Z170 Estrogen receptor positive status [ER+]: Secondary | ICD-10-CM | POA: Diagnosis not present

## 2020-10-13 DIAGNOSIS — R928 Other abnormal and inconclusive findings on diagnostic imaging of breast: Secondary | ICD-10-CM | POA: Diagnosis not present

## 2020-10-21 DIAGNOSIS — K769 Liver disease, unspecified: Secondary | ICD-10-CM | POA: Diagnosis not present

## 2020-10-21 DIAGNOSIS — C50311 Malignant neoplasm of lower-inner quadrant of right female breast: Secondary | ICD-10-CM | POA: Diagnosis not present

## 2020-10-21 DIAGNOSIS — K7689 Other specified diseases of liver: Secondary | ICD-10-CM | POA: Diagnosis not present

## 2020-10-21 DIAGNOSIS — C50911 Malignant neoplasm of unspecified site of right female breast: Secondary | ICD-10-CM | POA: Diagnosis not present

## 2020-10-26 ENCOUNTER — Other Ambulatory Visit: Payer: Self-pay | Admitting: *Deleted

## 2020-10-26 DIAGNOSIS — K7689 Other specified diseases of liver: Secondary | ICD-10-CM | POA: Diagnosis not present

## 2020-10-26 DIAGNOSIS — C50919 Malignant neoplasm of unspecified site of unspecified female breast: Secondary | ICD-10-CM | POA: Diagnosis not present

## 2020-10-26 MED ORDER — PANTOPRAZOLE SODIUM 40 MG PO TBEC: 40.0000 mg | DELAYED_RELEASE_TABLET | Freq: Every day | ORAL | 3 refills | Status: AC

## 2020-10-27 DIAGNOSIS — K769 Liver disease, unspecified: Secondary | ICD-10-CM | POA: Diagnosis not present

## 2020-10-27 DIAGNOSIS — C50311 Malignant neoplasm of lower-inner quadrant of right female breast: Secondary | ICD-10-CM | POA: Diagnosis not present

## 2020-10-27 DIAGNOSIS — Z170 Estrogen receptor positive status [ER+]: Secondary | ICD-10-CM | POA: Diagnosis not present

## 2020-11-04 DIAGNOSIS — K769 Liver disease, unspecified: Secondary | ICD-10-CM | POA: Diagnosis not present

## 2020-11-04 DIAGNOSIS — D134 Benign neoplasm of liver: Secondary | ICD-10-CM | POA: Diagnosis not present

## 2020-11-04 DIAGNOSIS — K7689 Other specified diseases of liver: Secondary | ICD-10-CM | POA: Diagnosis not present

## 2020-11-04 DIAGNOSIS — C50311 Malignant neoplasm of lower-inner quadrant of right female breast: Secondary | ICD-10-CM | POA: Diagnosis not present

## 2020-11-15 DIAGNOSIS — K769 Liver disease, unspecified: Secondary | ICD-10-CM | POA: Diagnosis not present

## 2020-11-15 DIAGNOSIS — H5712 Ocular pain, left eye: Secondary | ICD-10-CM | POA: Diagnosis not present

## 2020-11-15 DIAGNOSIS — H53122 Transient visual loss, left eye: Secondary | ICD-10-CM | POA: Diagnosis not present

## 2020-11-15 DIAGNOSIS — Z170 Estrogen receptor positive status [ER+]: Secondary | ICD-10-CM | POA: Diagnosis not present

## 2020-11-15 DIAGNOSIS — C50311 Malignant neoplasm of lower-inner quadrant of right female breast: Secondary | ICD-10-CM | POA: Diagnosis not present

## 2020-11-18 DIAGNOSIS — H547 Unspecified visual loss: Secondary | ICD-10-CM | POA: Diagnosis not present

## 2020-11-18 DIAGNOSIS — C50919 Malignant neoplasm of unspecified site of unspecified female breast: Secondary | ICD-10-CM | POA: Diagnosis not present

## 2020-11-18 DIAGNOSIS — G4700 Insomnia, unspecified: Secondary | ICD-10-CM | POA: Diagnosis not present

## 2020-11-22 DIAGNOSIS — H53122 Transient visual loss, left eye: Secondary | ICD-10-CM | POA: Diagnosis not present

## 2020-11-22 DIAGNOSIS — H5712 Ocular pain, left eye: Secondary | ICD-10-CM | POA: Diagnosis not present

## 2020-11-24 DIAGNOSIS — C50919 Malignant neoplasm of unspecified site of unspecified female breast: Secondary | ICD-10-CM | POA: Diagnosis not present

## 2020-11-24 DIAGNOSIS — K7689 Other specified diseases of liver: Secondary | ICD-10-CM | POA: Diagnosis not present

## 2020-11-24 DIAGNOSIS — K760 Fatty (change of) liver, not elsewhere classified: Secondary | ICD-10-CM | POA: Diagnosis not present

## 2020-11-24 DIAGNOSIS — K769 Liver disease, unspecified: Secondary | ICD-10-CM | POA: Diagnosis not present

## 2020-11-29 DIAGNOSIS — C50311 Malignant neoplasm of lower-inner quadrant of right female breast: Secondary | ICD-10-CM | POA: Diagnosis not present

## 2020-11-29 DIAGNOSIS — Z170 Estrogen receptor positive status [ER+]: Secondary | ICD-10-CM | POA: Diagnosis not present

## 2020-11-30 DIAGNOSIS — I361 Nonrheumatic tricuspid (valve) insufficiency: Secondary | ICD-10-CM | POA: Diagnosis not present

## 2020-11-30 DIAGNOSIS — Z0189 Encounter for other specified special examinations: Secondary | ICD-10-CM | POA: Diagnosis not present

## 2020-11-30 DIAGNOSIS — C50311 Malignant neoplasm of lower-inner quadrant of right female breast: Secondary | ICD-10-CM | POA: Diagnosis not present

## 2020-12-09 DIAGNOSIS — C50911 Malignant neoplasm of unspecified site of right female breast: Secondary | ICD-10-CM | POA: Diagnosis not present

## 2020-12-09 DIAGNOSIS — C50311 Malignant neoplasm of lower-inner quadrant of right female breast: Secondary | ICD-10-CM | POA: Diagnosis not present

## 2020-12-09 DIAGNOSIS — Z452 Encounter for adjustment and management of vascular access device: Secondary | ICD-10-CM | POA: Diagnosis not present

## 2020-12-09 DIAGNOSIS — K769 Liver disease, unspecified: Secondary | ICD-10-CM | POA: Diagnosis not present

## 2020-12-13 DIAGNOSIS — C50311 Malignant neoplasm of lower-inner quadrant of right female breast: Secondary | ICD-10-CM | POA: Diagnosis not present

## 2020-12-13 DIAGNOSIS — Z20822 Contact with and (suspected) exposure to covid-19: Secondary | ICD-10-CM | POA: Diagnosis not present

## 2020-12-13 DIAGNOSIS — Z5111 Encounter for antineoplastic chemotherapy: Secondary | ICD-10-CM | POA: Diagnosis not present

## 2020-12-13 DIAGNOSIS — Z170 Estrogen receptor positive status [ER+]: Secondary | ICD-10-CM | POA: Diagnosis not present

## 2020-12-13 DIAGNOSIS — K769 Liver disease, unspecified: Secondary | ICD-10-CM | POA: Diagnosis not present

## 2020-12-14 DIAGNOSIS — C50911 Malignant neoplasm of unspecified site of right female breast: Secondary | ICD-10-CM | POA: Diagnosis not present

## 2020-12-14 DIAGNOSIS — C50311 Malignant neoplasm of lower-inner quadrant of right female breast: Secondary | ICD-10-CM | POA: Diagnosis not present

## 2020-12-14 DIAGNOSIS — Z5111 Encounter for antineoplastic chemotherapy: Secondary | ICD-10-CM | POA: Diagnosis not present

## 2020-12-14 DIAGNOSIS — E049 Nontoxic goiter, unspecified: Secondary | ICD-10-CM | POA: Diagnosis not present

## 2020-12-14 DIAGNOSIS — G4700 Insomnia, unspecified: Secondary | ICD-10-CM | POA: Diagnosis not present

## 2020-12-14 DIAGNOSIS — Z20822 Contact with and (suspected) exposure to covid-19: Secondary | ICD-10-CM | POA: Diagnosis not present

## 2020-12-14 DIAGNOSIS — Z5189 Encounter for other specified aftercare: Secondary | ICD-10-CM | POA: Diagnosis not present

## 2020-12-14 DIAGNOSIS — R5383 Other fatigue: Secondary | ICD-10-CM | POA: Diagnosis not present

## 2020-12-23 DIAGNOSIS — C50311 Malignant neoplasm of lower-inner quadrant of right female breast: Secondary | ICD-10-CM | POA: Diagnosis not present

## 2020-12-23 DIAGNOSIS — Z170 Estrogen receptor positive status [ER+]: Secondary | ICD-10-CM | POA: Diagnosis not present

## 2020-12-23 DIAGNOSIS — K769 Liver disease, unspecified: Secondary | ICD-10-CM | POA: Diagnosis not present

## 2020-12-27 DIAGNOSIS — Z5189 Encounter for other specified aftercare: Secondary | ICD-10-CM | POA: Diagnosis not present

## 2020-12-27 DIAGNOSIS — Z20822 Contact with and (suspected) exposure to covid-19: Secondary | ICD-10-CM | POA: Diagnosis not present

## 2020-12-27 DIAGNOSIS — E049 Nontoxic goiter, unspecified: Secondary | ICD-10-CM | POA: Diagnosis not present

## 2020-12-27 DIAGNOSIS — G4700 Insomnia, unspecified: Secondary | ICD-10-CM | POA: Diagnosis not present

## 2020-12-27 DIAGNOSIS — Z5111 Encounter for antineoplastic chemotherapy: Secondary | ICD-10-CM | POA: Diagnosis not present

## 2020-12-27 DIAGNOSIS — C50911 Malignant neoplasm of unspecified site of right female breast: Secondary | ICD-10-CM | POA: Diagnosis not present

## 2020-12-27 DIAGNOSIS — R5383 Other fatigue: Secondary | ICD-10-CM | POA: Diagnosis not present

## 2020-12-27 DIAGNOSIS — C50311 Malignant neoplasm of lower-inner quadrant of right female breast: Secondary | ICD-10-CM | POA: Diagnosis not present

## 2021-01-06 DIAGNOSIS — M8589 Other specified disorders of bone density and structure, multiple sites: Secondary | ICD-10-CM | POA: Diagnosis not present

## 2021-01-10 DIAGNOSIS — F5102 Adjustment insomnia: Secondary | ICD-10-CM | POA: Diagnosis not present

## 2021-01-10 DIAGNOSIS — Z20822 Contact with and (suspected) exposure to covid-19: Secondary | ICD-10-CM | POA: Diagnosis not present

## 2021-01-10 DIAGNOSIS — R110 Nausea: Secondary | ICD-10-CM | POA: Diagnosis not present

## 2021-01-10 DIAGNOSIS — E049 Nontoxic goiter, unspecified: Secondary | ICD-10-CM | POA: Diagnosis not present

## 2021-01-10 DIAGNOSIS — Z5189 Encounter for other specified aftercare: Secondary | ICD-10-CM | POA: Diagnosis not present

## 2021-01-10 DIAGNOSIS — C50311 Malignant neoplasm of lower-inner quadrant of right female breast: Secondary | ICD-10-CM | POA: Diagnosis not present

## 2021-01-10 DIAGNOSIS — Z170 Estrogen receptor positive status [ER+]: Secondary | ICD-10-CM | POA: Diagnosis not present

## 2021-01-10 DIAGNOSIS — C50911 Malignant neoplasm of unspecified site of right female breast: Secondary | ICD-10-CM | POA: Diagnosis not present

## 2021-01-10 DIAGNOSIS — G4700 Insomnia, unspecified: Secondary | ICD-10-CM | POA: Diagnosis not present

## 2021-01-10 DIAGNOSIS — R5383 Other fatigue: Secondary | ICD-10-CM | POA: Diagnosis not present

## 2021-01-10 DIAGNOSIS — Z5111 Encounter for antineoplastic chemotherapy: Secondary | ICD-10-CM | POA: Diagnosis not present

## 2021-01-19 DIAGNOSIS — I519 Heart disease, unspecified: Secondary | ICD-10-CM | POA: Diagnosis not present

## 2021-01-19 DIAGNOSIS — Z79899 Other long term (current) drug therapy: Secondary | ICD-10-CM | POA: Diagnosis not present

## 2021-01-19 DIAGNOSIS — I371 Nonrheumatic pulmonary valve insufficiency: Secondary | ICD-10-CM | POA: Diagnosis not present

## 2021-01-19 DIAGNOSIS — Z853 Personal history of malignant neoplasm of breast: Secondary | ICD-10-CM | POA: Diagnosis not present

## 2021-01-19 DIAGNOSIS — I361 Nonrheumatic tricuspid (valve) insufficiency: Secondary | ICD-10-CM | POA: Diagnosis not present

## 2021-01-19 DIAGNOSIS — R002 Palpitations: Secondary | ICD-10-CM | POA: Diagnosis not present

## 2021-01-20 DIAGNOSIS — C50311 Malignant neoplasm of lower-inner quadrant of right female breast: Secondary | ICD-10-CM | POA: Diagnosis not present

## 2021-01-20 DIAGNOSIS — R002 Palpitations: Secondary | ICD-10-CM | POA: Diagnosis not present

## 2021-01-20 DIAGNOSIS — I493 Ventricular premature depolarization: Secondary | ICD-10-CM | POA: Diagnosis not present

## 2021-01-20 DIAGNOSIS — Z87891 Personal history of nicotine dependence: Secondary | ICD-10-CM | POA: Diagnosis not present

## 2021-01-24 DIAGNOSIS — C50919 Malignant neoplasm of unspecified site of unspecified female breast: Secondary | ICD-10-CM | POA: Diagnosis not present

## 2021-01-24 DIAGNOSIS — Z5111 Encounter for antineoplastic chemotherapy: Secondary | ICD-10-CM | POA: Diagnosis not present

## 2021-01-24 DIAGNOSIS — I7300 Raynaud's syndrome without gangrene: Secondary | ICD-10-CM | POA: Diagnosis not present

## 2021-01-24 DIAGNOSIS — C50311 Malignant neoplasm of lower-inner quadrant of right female breast: Secondary | ICD-10-CM | POA: Diagnosis not present

## 2021-01-24 DIAGNOSIS — Z853 Personal history of malignant neoplasm of breast: Secondary | ICD-10-CM | POA: Diagnosis not present

## 2021-01-24 DIAGNOSIS — K769 Liver disease, unspecified: Secondary | ICD-10-CM | POA: Diagnosis not present

## 2021-01-24 DIAGNOSIS — Z20822 Contact with and (suspected) exposure to covid-19: Secondary | ICD-10-CM | POA: Diagnosis not present

## 2021-01-24 DIAGNOSIS — Z170 Estrogen receptor positive status [ER+]: Secondary | ICD-10-CM | POA: Diagnosis not present

## 2021-01-26 DIAGNOSIS — R002 Palpitations: Secondary | ICD-10-CM | POA: Diagnosis not present

## 2021-02-03 DIAGNOSIS — I10 Essential (primary) hypertension: Secondary | ICD-10-CM | POA: Diagnosis not present

## 2021-02-03 DIAGNOSIS — I493 Ventricular premature depolarization: Secondary | ICD-10-CM | POA: Diagnosis not present

## 2021-02-03 DIAGNOSIS — C50311 Malignant neoplasm of lower-inner quadrant of right female breast: Secondary | ICD-10-CM | POA: Diagnosis not present

## 2021-02-07 DIAGNOSIS — C50919 Malignant neoplasm of unspecified site of unspecified female breast: Secondary | ICD-10-CM | POA: Diagnosis not present

## 2021-02-07 DIAGNOSIS — Z853 Personal history of malignant neoplasm of breast: Secondary | ICD-10-CM | POA: Diagnosis not present

## 2021-02-07 DIAGNOSIS — R002 Palpitations: Secondary | ICD-10-CM | POA: Diagnosis not present

## 2021-02-07 DIAGNOSIS — C50311 Malignant neoplasm of lower-inner quadrant of right female breast: Secondary | ICD-10-CM | POA: Diagnosis not present

## 2021-02-07 DIAGNOSIS — Z20822 Contact with and (suspected) exposure to covid-19: Secondary | ICD-10-CM | POA: Diagnosis not present

## 2021-02-07 DIAGNOSIS — Z5111 Encounter for antineoplastic chemotherapy: Secondary | ICD-10-CM | POA: Diagnosis not present

## 2021-02-07 DIAGNOSIS — Z170 Estrogen receptor positive status [ER+]: Secondary | ICD-10-CM | POA: Diagnosis not present

## 2021-02-21 DIAGNOSIS — Z170 Estrogen receptor positive status [ER+]: Secondary | ICD-10-CM | POA: Diagnosis not present

## 2021-02-21 DIAGNOSIS — Z853 Personal history of malignant neoplasm of breast: Secondary | ICD-10-CM | POA: Diagnosis not present

## 2021-02-21 DIAGNOSIS — Z5111 Encounter for antineoplastic chemotherapy: Secondary | ICD-10-CM | POA: Diagnosis not present

## 2021-02-21 DIAGNOSIS — C50311 Malignant neoplasm of lower-inner quadrant of right female breast: Secondary | ICD-10-CM | POA: Diagnosis not present

## 2021-03-07 DIAGNOSIS — C50311 Malignant neoplasm of lower-inner quadrant of right female breast: Secondary | ICD-10-CM | POA: Diagnosis not present

## 2021-03-07 DIAGNOSIS — K769 Liver disease, unspecified: Secondary | ICD-10-CM | POA: Diagnosis not present

## 2021-03-07 DIAGNOSIS — Z853 Personal history of malignant neoplasm of breast: Secondary | ICD-10-CM | POA: Diagnosis not present

## 2021-03-07 DIAGNOSIS — Z5111 Encounter for antineoplastic chemotherapy: Secondary | ICD-10-CM | POA: Diagnosis not present

## 2021-03-07 DIAGNOSIS — Z170 Estrogen receptor positive status [ER+]: Secondary | ICD-10-CM | POA: Diagnosis not present

## 2021-03-21 DIAGNOSIS — Z5111 Encounter for antineoplastic chemotherapy: Secondary | ICD-10-CM | POA: Diagnosis not present

## 2021-03-21 DIAGNOSIS — Z853 Personal history of malignant neoplasm of breast: Secondary | ICD-10-CM | POA: Diagnosis not present

## 2021-03-21 DIAGNOSIS — C50311 Malignant neoplasm of lower-inner quadrant of right female breast: Secondary | ICD-10-CM | POA: Diagnosis not present

## 2021-03-21 DIAGNOSIS — K769 Liver disease, unspecified: Secondary | ICD-10-CM | POA: Diagnosis not present

## 2021-03-21 DIAGNOSIS — Z170 Estrogen receptor positive status [ER+]: Secondary | ICD-10-CM | POA: Diagnosis not present

## 2021-03-23 DIAGNOSIS — Z170 Estrogen receptor positive status [ER+]: Secondary | ICD-10-CM | POA: Diagnosis not present

## 2021-03-23 DIAGNOSIS — C50311 Malignant neoplasm of lower-inner quadrant of right female breast: Secondary | ICD-10-CM | POA: Diagnosis not present

## 2021-03-23 DIAGNOSIS — C50919 Malignant neoplasm of unspecified site of unspecified female breast: Secondary | ICD-10-CM | POA: Diagnosis not present

## 2021-03-23 DIAGNOSIS — K769 Liver disease, unspecified: Secondary | ICD-10-CM | POA: Diagnosis not present

## 2021-03-25 DIAGNOSIS — I429 Cardiomyopathy, unspecified: Secondary | ICD-10-CM | POA: Diagnosis not present

## 2021-04-13 DIAGNOSIS — C50811 Malignant neoplasm of overlapping sites of right female breast: Secondary | ICD-10-CM | POA: Diagnosis not present

## 2021-04-19 DIAGNOSIS — C50311 Malignant neoplasm of lower-inner quadrant of right female breast: Secondary | ICD-10-CM | POA: Diagnosis not present

## 2021-04-19 DIAGNOSIS — Z170 Estrogen receptor positive status [ER+]: Secondary | ICD-10-CM | POA: Diagnosis not present

## 2021-04-29 DIAGNOSIS — I429 Cardiomyopathy, unspecified: Secondary | ICD-10-CM | POA: Diagnosis not present

## 2021-05-05 DIAGNOSIS — Z170 Estrogen receptor positive status [ER+]: Secondary | ICD-10-CM | POA: Diagnosis not present

## 2021-05-05 DIAGNOSIS — Z9013 Acquired absence of bilateral breasts and nipples: Secondary | ICD-10-CM | POA: Diagnosis not present

## 2021-05-05 DIAGNOSIS — N6310 Unspecified lump in the right breast, unspecified quadrant: Secondary | ICD-10-CM | POA: Diagnosis not present

## 2021-05-05 DIAGNOSIS — Z452 Encounter for adjustment and management of vascular access device: Secondary | ICD-10-CM | POA: Diagnosis not present

## 2021-05-05 DIAGNOSIS — Z9221 Personal history of antineoplastic chemotherapy: Secondary | ICD-10-CM | POA: Diagnosis not present

## 2021-05-05 DIAGNOSIS — C50911 Malignant neoplasm of unspecified site of right female breast: Secondary | ICD-10-CM | POA: Diagnosis not present

## 2021-05-05 DIAGNOSIS — C50811 Malignant neoplasm of overlapping sites of right female breast: Secondary | ICD-10-CM | POA: Diagnosis not present

## 2021-05-05 DIAGNOSIS — N6022 Fibroadenosis of left breast: Secondary | ICD-10-CM | POA: Diagnosis not present

## 2021-05-17 DIAGNOSIS — Z170 Estrogen receptor positive status [ER+]: Secondary | ICD-10-CM | POA: Diagnosis not present

## 2021-05-17 DIAGNOSIS — C50311 Malignant neoplasm of lower-inner quadrant of right female breast: Secondary | ICD-10-CM | POA: Diagnosis not present

## 2021-05-18 DIAGNOSIS — R002 Palpitations: Secondary | ICD-10-CM | POA: Diagnosis not present

## 2021-05-18 DIAGNOSIS — M85852 Other specified disorders of bone density and structure, left thigh: Secondary | ICD-10-CM | POA: Diagnosis not present

## 2021-05-18 DIAGNOSIS — Z170 Estrogen receptor positive status [ER+]: Secondary | ICD-10-CM | POA: Diagnosis not present

## 2021-05-18 DIAGNOSIS — Z9013 Acquired absence of bilateral breasts and nipples: Secondary | ICD-10-CM | POA: Diagnosis not present

## 2021-05-18 DIAGNOSIS — C50311 Malignant neoplasm of lower-inner quadrant of right female breast: Secondary | ICD-10-CM | POA: Diagnosis not present

## 2021-05-19 DIAGNOSIS — K080 Exfoliation of teeth due to systemic causes: Secondary | ICD-10-CM | POA: Diagnosis not present

## 2021-05-23 DIAGNOSIS — M25611 Stiffness of right shoulder, not elsewhere classified: Secondary | ICD-10-CM | POA: Diagnosis not present

## 2021-05-23 DIAGNOSIS — M25612 Stiffness of left shoulder, not elsewhere classified: Secondary | ICD-10-CM | POA: Diagnosis not present

## 2021-05-23 DIAGNOSIS — M799 Soft tissue disorder, unspecified: Secondary | ICD-10-CM | POA: Diagnosis not present

## 2021-05-23 DIAGNOSIS — Z170 Estrogen receptor positive status [ER+]: Secondary | ICD-10-CM | POA: Diagnosis not present

## 2021-05-23 DIAGNOSIS — M25619 Stiffness of unspecified shoulder, not elsewhere classified: Secondary | ICD-10-CM | POA: Diagnosis not present

## 2021-05-23 DIAGNOSIS — C50311 Malignant neoplasm of lower-inner quadrant of right female breast: Secondary | ICD-10-CM | POA: Diagnosis not present

## 2021-05-25 DIAGNOSIS — Z9013 Acquired absence of bilateral breasts and nipples: Secondary | ICD-10-CM | POA: Diagnosis not present

## 2021-05-25 DIAGNOSIS — Z9889 Other specified postprocedural states: Secondary | ICD-10-CM | POA: Diagnosis not present

## 2021-05-31 DIAGNOSIS — Z170 Estrogen receptor positive status [ER+]: Secondary | ICD-10-CM | POA: Diagnosis not present

## 2021-05-31 DIAGNOSIS — M25612 Stiffness of left shoulder, not elsewhere classified: Secondary | ICD-10-CM | POA: Diagnosis not present

## 2021-05-31 DIAGNOSIS — M799 Soft tissue disorder, unspecified: Secondary | ICD-10-CM | POA: Diagnosis not present

## 2021-05-31 DIAGNOSIS — M25619 Stiffness of unspecified shoulder, not elsewhere classified: Secondary | ICD-10-CM | POA: Diagnosis not present

## 2021-05-31 DIAGNOSIS — C50311 Malignant neoplasm of lower-inner quadrant of right female breast: Secondary | ICD-10-CM | POA: Diagnosis not present

## 2021-05-31 DIAGNOSIS — M25611 Stiffness of right shoulder, not elsewhere classified: Secondary | ICD-10-CM | POA: Diagnosis not present

## 2021-06-01 DIAGNOSIS — D0511 Intraductal carcinoma in situ of right breast: Secondary | ICD-10-CM | POA: Diagnosis not present

## 2021-06-02 DIAGNOSIS — Z1211 Encounter for screening for malignant neoplasm of colon: Secondary | ICD-10-CM | POA: Diagnosis not present

## 2021-06-02 DIAGNOSIS — M25611 Stiffness of right shoulder, not elsewhere classified: Secondary | ICD-10-CM | POA: Diagnosis not present

## 2021-06-02 DIAGNOSIS — M25619 Stiffness of unspecified shoulder, not elsewhere classified: Secondary | ICD-10-CM | POA: Diagnosis not present

## 2021-06-02 DIAGNOSIS — R232 Flushing: Secondary | ICD-10-CM | POA: Diagnosis not present

## 2021-06-02 DIAGNOSIS — Z170 Estrogen receptor positive status [ER+]: Secondary | ICD-10-CM | POA: Diagnosis not present

## 2021-06-02 DIAGNOSIS — M25612 Stiffness of left shoulder, not elsewhere classified: Secondary | ICD-10-CM | POA: Diagnosis not present

## 2021-06-02 DIAGNOSIS — Z124 Encounter for screening for malignant neoplasm of cervix: Secondary | ICD-10-CM | POA: Diagnosis not present

## 2021-06-02 DIAGNOSIS — C50311 Malignant neoplasm of lower-inner quadrant of right female breast: Secondary | ICD-10-CM | POA: Diagnosis not present

## 2021-06-02 DIAGNOSIS — Z0000 Encounter for general adult medical examination without abnormal findings: Secondary | ICD-10-CM | POA: Diagnosis not present

## 2021-06-02 DIAGNOSIS — M799 Soft tissue disorder, unspecified: Secondary | ICD-10-CM | POA: Diagnosis not present

## 2021-06-06 DIAGNOSIS — Z170 Estrogen receptor positive status [ER+]: Secondary | ICD-10-CM | POA: Diagnosis not present

## 2021-06-06 DIAGNOSIS — Z510 Encounter for antineoplastic radiation therapy: Secondary | ICD-10-CM | POA: Diagnosis not present

## 2021-06-06 DIAGNOSIS — Z9013 Acquired absence of bilateral breasts and nipples: Secondary | ICD-10-CM | POA: Diagnosis not present

## 2021-06-06 DIAGNOSIS — C50311 Malignant neoplasm of lower-inner quadrant of right female breast: Secondary | ICD-10-CM | POA: Diagnosis not present

## 2021-06-06 DIAGNOSIS — Z87891 Personal history of nicotine dependence: Secondary | ICD-10-CM | POA: Diagnosis not present

## 2021-06-07 DIAGNOSIS — Z9889 Other specified postprocedural states: Secondary | ICD-10-CM | POA: Diagnosis not present

## 2021-06-08 DIAGNOSIS — M799 Soft tissue disorder, unspecified: Secondary | ICD-10-CM | POA: Diagnosis not present

## 2021-06-08 DIAGNOSIS — M25619 Stiffness of unspecified shoulder, not elsewhere classified: Secondary | ICD-10-CM | POA: Diagnosis not present

## 2021-06-08 DIAGNOSIS — Z170 Estrogen receptor positive status [ER+]: Secondary | ICD-10-CM | POA: Diagnosis not present

## 2021-06-08 DIAGNOSIS — M25611 Stiffness of right shoulder, not elsewhere classified: Secondary | ICD-10-CM | POA: Diagnosis not present

## 2021-06-08 DIAGNOSIS — M25612 Stiffness of left shoulder, not elsewhere classified: Secondary | ICD-10-CM | POA: Diagnosis not present

## 2021-06-08 DIAGNOSIS — C50311 Malignant neoplasm of lower-inner quadrant of right female breast: Secondary | ICD-10-CM | POA: Diagnosis not present

## 2021-06-09 DIAGNOSIS — Z510 Encounter for antineoplastic radiation therapy: Secondary | ICD-10-CM | POA: Diagnosis not present

## 2021-06-09 DIAGNOSIS — Z9013 Acquired absence of bilateral breasts and nipples: Secondary | ICD-10-CM | POA: Diagnosis not present

## 2021-06-09 DIAGNOSIS — C50311 Malignant neoplasm of lower-inner quadrant of right female breast: Secondary | ICD-10-CM | POA: Diagnosis not present

## 2021-06-09 DIAGNOSIS — Z87891 Personal history of nicotine dependence: Secondary | ICD-10-CM | POA: Diagnosis not present

## 2021-06-09 DIAGNOSIS — Z170 Estrogen receptor positive status [ER+]: Secondary | ICD-10-CM | POA: Diagnosis not present

## 2021-06-14 DIAGNOSIS — H52203 Unspecified astigmatism, bilateral: Secondary | ICD-10-CM | POA: Diagnosis not present

## 2021-06-14 DIAGNOSIS — H524 Presbyopia: Secondary | ICD-10-CM | POA: Diagnosis not present

## 2021-06-14 DIAGNOSIS — H53122 Transient visual loss, left eye: Secondary | ICD-10-CM | POA: Diagnosis not present

## 2021-06-14 DIAGNOSIS — H5213 Myopia, bilateral: Secondary | ICD-10-CM | POA: Diagnosis not present

## 2021-06-15 DIAGNOSIS — Z170 Estrogen receptor positive status [ER+]: Secondary | ICD-10-CM | POA: Diagnosis not present

## 2021-06-15 DIAGNOSIS — C50311 Malignant neoplasm of lower-inner quadrant of right female breast: Secondary | ICD-10-CM | POA: Diagnosis not present

## 2021-06-15 DIAGNOSIS — N3000 Acute cystitis without hematuria: Secondary | ICD-10-CM | POA: Diagnosis not present

## 2021-06-15 DIAGNOSIS — Z510 Encounter for antineoplastic radiation therapy: Secondary | ICD-10-CM | POA: Diagnosis not present

## 2021-06-20 DIAGNOSIS — Z170 Estrogen receptor positive status [ER+]: Secondary | ICD-10-CM | POA: Diagnosis not present

## 2021-06-20 DIAGNOSIS — C50311 Malignant neoplasm of lower-inner quadrant of right female breast: Secondary | ICD-10-CM | POA: Diagnosis not present

## 2021-06-20 DIAGNOSIS — Z510 Encounter for antineoplastic radiation therapy: Secondary | ICD-10-CM | POA: Diagnosis not present

## 2021-06-20 DIAGNOSIS — N3000 Acute cystitis without hematuria: Secondary | ICD-10-CM | POA: Diagnosis not present

## 2021-06-21 DIAGNOSIS — Z510 Encounter for antineoplastic radiation therapy: Secondary | ICD-10-CM | POA: Diagnosis not present

## 2021-06-21 DIAGNOSIS — N3000 Acute cystitis without hematuria: Secondary | ICD-10-CM | POA: Diagnosis not present

## 2021-06-21 DIAGNOSIS — Z170 Estrogen receptor positive status [ER+]: Secondary | ICD-10-CM | POA: Diagnosis not present

## 2021-06-21 DIAGNOSIS — C50311 Malignant neoplasm of lower-inner quadrant of right female breast: Secondary | ICD-10-CM | POA: Diagnosis not present

## 2021-06-22 DIAGNOSIS — C50311 Malignant neoplasm of lower-inner quadrant of right female breast: Secondary | ICD-10-CM | POA: Diagnosis not present

## 2021-06-22 DIAGNOSIS — Z170 Estrogen receptor positive status [ER+]: Secondary | ICD-10-CM | POA: Diagnosis not present

## 2021-06-22 DIAGNOSIS — Z510 Encounter for antineoplastic radiation therapy: Secondary | ICD-10-CM | POA: Diagnosis not present

## 2021-06-22 DIAGNOSIS — N3000 Acute cystitis without hematuria: Secondary | ICD-10-CM | POA: Diagnosis not present

## 2021-06-23 DIAGNOSIS — N3000 Acute cystitis without hematuria: Secondary | ICD-10-CM | POA: Diagnosis not present

## 2021-06-23 DIAGNOSIS — Z510 Encounter for antineoplastic radiation therapy: Secondary | ICD-10-CM | POA: Diagnosis not present

## 2021-06-23 DIAGNOSIS — C50311 Malignant neoplasm of lower-inner quadrant of right female breast: Secondary | ICD-10-CM | POA: Diagnosis not present

## 2021-06-23 DIAGNOSIS — Z170 Estrogen receptor positive status [ER+]: Secondary | ICD-10-CM | POA: Diagnosis not present

## 2021-06-24 DIAGNOSIS — N3000 Acute cystitis without hematuria: Secondary | ICD-10-CM | POA: Diagnosis not present

## 2021-06-24 DIAGNOSIS — Z170 Estrogen receptor positive status [ER+]: Secondary | ICD-10-CM | POA: Diagnosis not present

## 2021-06-24 DIAGNOSIS — Z510 Encounter for antineoplastic radiation therapy: Secondary | ICD-10-CM | POA: Diagnosis not present

## 2021-06-24 DIAGNOSIS — C50311 Malignant neoplasm of lower-inner quadrant of right female breast: Secondary | ICD-10-CM | POA: Diagnosis not present

## 2021-06-27 DIAGNOSIS — Z170 Estrogen receptor positive status [ER+]: Secondary | ICD-10-CM | POA: Diagnosis not present

## 2021-06-27 DIAGNOSIS — C50311 Malignant neoplasm of lower-inner quadrant of right female breast: Secondary | ICD-10-CM | POA: Diagnosis not present

## 2021-06-27 DIAGNOSIS — Z510 Encounter for antineoplastic radiation therapy: Secondary | ICD-10-CM | POA: Diagnosis not present

## 2021-06-27 DIAGNOSIS — N3000 Acute cystitis without hematuria: Secondary | ICD-10-CM | POA: Diagnosis not present

## 2021-06-28 DIAGNOSIS — Z510 Encounter for antineoplastic radiation therapy: Secondary | ICD-10-CM | POA: Diagnosis not present

## 2021-06-28 DIAGNOSIS — N3000 Acute cystitis without hematuria: Secondary | ICD-10-CM | POA: Diagnosis not present

## 2021-06-28 DIAGNOSIS — C50311 Malignant neoplasm of lower-inner quadrant of right female breast: Secondary | ICD-10-CM | POA: Diagnosis not present

## 2021-06-28 DIAGNOSIS — Z170 Estrogen receptor positive status [ER+]: Secondary | ICD-10-CM | POA: Diagnosis not present

## 2021-06-29 DIAGNOSIS — Z170 Estrogen receptor positive status [ER+]: Secondary | ICD-10-CM | POA: Diagnosis not present

## 2021-06-29 DIAGNOSIS — N3000 Acute cystitis without hematuria: Secondary | ICD-10-CM | POA: Diagnosis not present

## 2021-06-29 DIAGNOSIS — C50311 Malignant neoplasm of lower-inner quadrant of right female breast: Secondary | ICD-10-CM | POA: Diagnosis not present

## 2021-06-29 DIAGNOSIS — Z510 Encounter for antineoplastic radiation therapy: Secondary | ICD-10-CM | POA: Diagnosis not present

## 2021-06-30 DIAGNOSIS — N3000 Acute cystitis without hematuria: Secondary | ICD-10-CM | POA: Diagnosis not present

## 2021-06-30 DIAGNOSIS — C50311 Malignant neoplasm of lower-inner quadrant of right female breast: Secondary | ICD-10-CM | POA: Diagnosis not present

## 2021-06-30 DIAGNOSIS — Z510 Encounter for antineoplastic radiation therapy: Secondary | ICD-10-CM | POA: Diagnosis not present

## 2021-06-30 DIAGNOSIS — Z170 Estrogen receptor positive status [ER+]: Secondary | ICD-10-CM | POA: Diagnosis not present

## 2021-07-01 DIAGNOSIS — Z510 Encounter for antineoplastic radiation therapy: Secondary | ICD-10-CM | POA: Diagnosis not present

## 2021-07-01 DIAGNOSIS — N3000 Acute cystitis without hematuria: Secondary | ICD-10-CM | POA: Diagnosis not present

## 2021-07-01 DIAGNOSIS — Z170 Estrogen receptor positive status [ER+]: Secondary | ICD-10-CM | POA: Diagnosis not present

## 2021-07-01 DIAGNOSIS — C50311 Malignant neoplasm of lower-inner quadrant of right female breast: Secondary | ICD-10-CM | POA: Diagnosis not present

## 2021-07-04 DIAGNOSIS — Z510 Encounter for antineoplastic radiation therapy: Secondary | ICD-10-CM | POA: Diagnosis not present

## 2021-07-04 DIAGNOSIS — N3000 Acute cystitis without hematuria: Secondary | ICD-10-CM | POA: Diagnosis not present

## 2021-07-04 DIAGNOSIS — Z170 Estrogen receptor positive status [ER+]: Secondary | ICD-10-CM | POA: Diagnosis not present

## 2021-07-04 DIAGNOSIS — C50311 Malignant neoplasm of lower-inner quadrant of right female breast: Secondary | ICD-10-CM | POA: Diagnosis not present

## 2021-07-05 DIAGNOSIS — N3000 Acute cystitis without hematuria: Secondary | ICD-10-CM | POA: Diagnosis not present

## 2021-07-05 DIAGNOSIS — Z170 Estrogen receptor positive status [ER+]: Secondary | ICD-10-CM | POA: Diagnosis not present

## 2021-07-05 DIAGNOSIS — C50311 Malignant neoplasm of lower-inner quadrant of right female breast: Secondary | ICD-10-CM | POA: Diagnosis not present

## 2021-07-05 DIAGNOSIS — Z510 Encounter for antineoplastic radiation therapy: Secondary | ICD-10-CM | POA: Diagnosis not present

## 2021-07-06 DIAGNOSIS — Z510 Encounter for antineoplastic radiation therapy: Secondary | ICD-10-CM | POA: Diagnosis not present

## 2021-07-06 DIAGNOSIS — Z170 Estrogen receptor positive status [ER+]: Secondary | ICD-10-CM | POA: Diagnosis not present

## 2021-07-06 DIAGNOSIS — N3000 Acute cystitis without hematuria: Secondary | ICD-10-CM | POA: Diagnosis not present

## 2021-07-06 DIAGNOSIS — C50311 Malignant neoplasm of lower-inner quadrant of right female breast: Secondary | ICD-10-CM | POA: Diagnosis not present

## 2021-07-07 DIAGNOSIS — Z170 Estrogen receptor positive status [ER+]: Secondary | ICD-10-CM | POA: Diagnosis not present

## 2021-07-07 DIAGNOSIS — Z510 Encounter for antineoplastic radiation therapy: Secondary | ICD-10-CM | POA: Diagnosis not present

## 2021-07-07 DIAGNOSIS — N3000 Acute cystitis without hematuria: Secondary | ICD-10-CM | POA: Diagnosis not present

## 2021-07-07 DIAGNOSIS — C50311 Malignant neoplasm of lower-inner quadrant of right female breast: Secondary | ICD-10-CM | POA: Diagnosis not present

## 2021-07-08 DIAGNOSIS — Z170 Estrogen receptor positive status [ER+]: Secondary | ICD-10-CM | POA: Diagnosis not present

## 2021-07-08 DIAGNOSIS — C50311 Malignant neoplasm of lower-inner quadrant of right female breast: Secondary | ICD-10-CM | POA: Diagnosis not present

## 2021-07-08 DIAGNOSIS — Z510 Encounter for antineoplastic radiation therapy: Secondary | ICD-10-CM | POA: Diagnosis not present

## 2021-07-08 DIAGNOSIS — N3000 Acute cystitis without hematuria: Secondary | ICD-10-CM | POA: Diagnosis not present

## 2021-07-11 DIAGNOSIS — C50311 Malignant neoplasm of lower-inner quadrant of right female breast: Secondary | ICD-10-CM | POA: Diagnosis not present

## 2021-07-11 DIAGNOSIS — N3000 Acute cystitis without hematuria: Secondary | ICD-10-CM | POA: Diagnosis not present

## 2021-07-11 DIAGNOSIS — Z510 Encounter for antineoplastic radiation therapy: Secondary | ICD-10-CM | POA: Diagnosis not present

## 2021-07-11 DIAGNOSIS — Z170 Estrogen receptor positive status [ER+]: Secondary | ICD-10-CM | POA: Diagnosis not present

## 2021-07-12 DIAGNOSIS — Z510 Encounter for antineoplastic radiation therapy: Secondary | ICD-10-CM | POA: Diagnosis not present

## 2021-07-12 DIAGNOSIS — Z170 Estrogen receptor positive status [ER+]: Secondary | ICD-10-CM | POA: Diagnosis not present

## 2021-07-12 DIAGNOSIS — C50311 Malignant neoplasm of lower-inner quadrant of right female breast: Secondary | ICD-10-CM | POA: Diagnosis not present

## 2021-07-12 DIAGNOSIS — N3000 Acute cystitis without hematuria: Secondary | ICD-10-CM | POA: Diagnosis not present

## 2021-07-13 DIAGNOSIS — Z510 Encounter for antineoplastic radiation therapy: Secondary | ICD-10-CM | POA: Diagnosis not present

## 2021-07-13 DIAGNOSIS — C50311 Malignant neoplasm of lower-inner quadrant of right female breast: Secondary | ICD-10-CM | POA: Diagnosis not present

## 2021-07-13 DIAGNOSIS — Z170 Estrogen receptor positive status [ER+]: Secondary | ICD-10-CM | POA: Diagnosis not present

## 2021-07-13 DIAGNOSIS — N3000 Acute cystitis without hematuria: Secondary | ICD-10-CM | POA: Diagnosis not present

## 2021-07-14 DIAGNOSIS — C50311 Malignant neoplasm of lower-inner quadrant of right female breast: Secondary | ICD-10-CM | POA: Diagnosis not present

## 2021-07-14 DIAGNOSIS — Z170 Estrogen receptor positive status [ER+]: Secondary | ICD-10-CM | POA: Diagnosis not present

## 2021-07-14 DIAGNOSIS — Z510 Encounter for antineoplastic radiation therapy: Secondary | ICD-10-CM | POA: Diagnosis not present

## 2021-07-15 DIAGNOSIS — Z170 Estrogen receptor positive status [ER+]: Secondary | ICD-10-CM | POA: Diagnosis not present

## 2021-07-15 DIAGNOSIS — C50311 Malignant neoplasm of lower-inner quadrant of right female breast: Secondary | ICD-10-CM | POA: Diagnosis not present

## 2021-07-15 DIAGNOSIS — Z510 Encounter for antineoplastic radiation therapy: Secondary | ICD-10-CM | POA: Diagnosis not present

## 2021-07-19 DIAGNOSIS — C50311 Malignant neoplasm of lower-inner quadrant of right female breast: Secondary | ICD-10-CM | POA: Diagnosis not present

## 2021-07-19 DIAGNOSIS — Z170 Estrogen receptor positive status [ER+]: Secondary | ICD-10-CM | POA: Diagnosis not present

## 2021-07-19 DIAGNOSIS — Z510 Encounter for antineoplastic radiation therapy: Secondary | ICD-10-CM | POA: Diagnosis not present

## 2021-07-20 DIAGNOSIS — Z510 Encounter for antineoplastic radiation therapy: Secondary | ICD-10-CM | POA: Diagnosis not present

## 2021-07-20 DIAGNOSIS — C50311 Malignant neoplasm of lower-inner quadrant of right female breast: Secondary | ICD-10-CM | POA: Diagnosis not present

## 2021-07-20 DIAGNOSIS — Z170 Estrogen receptor positive status [ER+]: Secondary | ICD-10-CM | POA: Diagnosis not present

## 2021-07-21 DIAGNOSIS — C50311 Malignant neoplasm of lower-inner quadrant of right female breast: Secondary | ICD-10-CM | POA: Diagnosis not present

## 2021-07-21 DIAGNOSIS — Z510 Encounter for antineoplastic radiation therapy: Secondary | ICD-10-CM | POA: Diagnosis not present

## 2021-07-21 DIAGNOSIS — Z170 Estrogen receptor positive status [ER+]: Secondary | ICD-10-CM | POA: Diagnosis not present

## 2021-07-22 DIAGNOSIS — Z510 Encounter for antineoplastic radiation therapy: Secondary | ICD-10-CM | POA: Diagnosis not present

## 2021-07-22 DIAGNOSIS — Z170 Estrogen receptor positive status [ER+]: Secondary | ICD-10-CM | POA: Diagnosis not present

## 2021-07-22 DIAGNOSIS — C50311 Malignant neoplasm of lower-inner quadrant of right female breast: Secondary | ICD-10-CM | POA: Diagnosis not present

## 2021-07-25 DIAGNOSIS — C50311 Malignant neoplasm of lower-inner quadrant of right female breast: Secondary | ICD-10-CM | POA: Diagnosis not present

## 2021-07-25 DIAGNOSIS — Z170 Estrogen receptor positive status [ER+]: Secondary | ICD-10-CM | POA: Diagnosis not present

## 2021-07-25 DIAGNOSIS — Z510 Encounter for antineoplastic radiation therapy: Secondary | ICD-10-CM | POA: Diagnosis not present

## 2021-07-26 DIAGNOSIS — Z170 Estrogen receptor positive status [ER+]: Secondary | ICD-10-CM | POA: Diagnosis not present

## 2021-07-26 DIAGNOSIS — C50311 Malignant neoplasm of lower-inner quadrant of right female breast: Secondary | ICD-10-CM | POA: Diagnosis not present

## 2021-07-26 DIAGNOSIS — Z510 Encounter for antineoplastic radiation therapy: Secondary | ICD-10-CM | POA: Diagnosis not present

## 2021-07-27 DIAGNOSIS — Z510 Encounter for antineoplastic radiation therapy: Secondary | ICD-10-CM | POA: Diagnosis not present

## 2021-07-27 DIAGNOSIS — Z170 Estrogen receptor positive status [ER+]: Secondary | ICD-10-CM | POA: Diagnosis not present

## 2021-07-27 DIAGNOSIS — C50311 Malignant neoplasm of lower-inner quadrant of right female breast: Secondary | ICD-10-CM | POA: Diagnosis not present

## 2021-07-28 DIAGNOSIS — Z170 Estrogen receptor positive status [ER+]: Secondary | ICD-10-CM | POA: Diagnosis not present

## 2021-07-28 DIAGNOSIS — C50311 Malignant neoplasm of lower-inner quadrant of right female breast: Secondary | ICD-10-CM | POA: Diagnosis not present

## 2021-07-28 DIAGNOSIS — Z510 Encounter for antineoplastic radiation therapy: Secondary | ICD-10-CM | POA: Diagnosis not present

## 2021-07-29 DIAGNOSIS — H5347 Heteronymous bilateral field defects: Secondary | ICD-10-CM | POA: Diagnosis not present

## 2021-08-04 DIAGNOSIS — I6529 Occlusion and stenosis of unspecified carotid artery: Secondary | ICD-10-CM | POA: Diagnosis not present

## 2021-08-08 DIAGNOSIS — C50311 Malignant neoplasm of lower-inner quadrant of right female breast: Secondary | ICD-10-CM | POA: Diagnosis not present

## 2021-08-08 DIAGNOSIS — Z170 Estrogen receptor positive status [ER+]: Secondary | ICD-10-CM | POA: Diagnosis not present

## 2021-08-08 DIAGNOSIS — Z510 Encounter for antineoplastic radiation therapy: Secondary | ICD-10-CM | POA: Diagnosis not present

## 2021-08-09 DIAGNOSIS — Z510 Encounter for antineoplastic radiation therapy: Secondary | ICD-10-CM | POA: Diagnosis not present

## 2021-08-09 DIAGNOSIS — C50311 Malignant neoplasm of lower-inner quadrant of right female breast: Secondary | ICD-10-CM | POA: Diagnosis not present

## 2021-08-09 DIAGNOSIS — Z170 Estrogen receptor positive status [ER+]: Secondary | ICD-10-CM | POA: Diagnosis not present

## 2021-08-10 DIAGNOSIS — Z170 Estrogen receptor positive status [ER+]: Secondary | ICD-10-CM | POA: Diagnosis not present

## 2021-08-10 DIAGNOSIS — Z510 Encounter for antineoplastic radiation therapy: Secondary | ICD-10-CM | POA: Diagnosis not present

## 2021-08-10 DIAGNOSIS — C50311 Malignant neoplasm of lower-inner quadrant of right female breast: Secondary | ICD-10-CM | POA: Diagnosis not present

## 2021-08-11 DIAGNOSIS — Z170 Estrogen receptor positive status [ER+]: Secondary | ICD-10-CM | POA: Diagnosis not present

## 2021-08-11 DIAGNOSIS — Z510 Encounter for antineoplastic radiation therapy: Secondary | ICD-10-CM | POA: Diagnosis not present

## 2021-08-11 DIAGNOSIS — C50311 Malignant neoplasm of lower-inner quadrant of right female breast: Secondary | ICD-10-CM | POA: Diagnosis not present

## 2021-08-15 DIAGNOSIS — C50311 Malignant neoplasm of lower-inner quadrant of right female breast: Secondary | ICD-10-CM | POA: Diagnosis not present

## 2021-08-15 DIAGNOSIS — M8580 Other specified disorders of bone density and structure, unspecified site: Secondary | ICD-10-CM | POA: Diagnosis not present

## 2021-08-15 DIAGNOSIS — Z170 Estrogen receptor positive status [ER+]: Secondary | ICD-10-CM | POA: Diagnosis not present

## 2021-08-15 DIAGNOSIS — M85852 Other specified disorders of bone density and structure, left thigh: Secondary | ICD-10-CM | POA: Diagnosis not present

## 2021-08-15 DIAGNOSIS — G620 Drug-induced polyneuropathy: Secondary | ICD-10-CM | POA: Diagnosis not present

## 2021-08-15 DIAGNOSIS — C50911 Malignant neoplasm of unspecified site of right female breast: Secondary | ICD-10-CM | POA: Diagnosis not present

## 2021-08-30 DIAGNOSIS — Z170 Estrogen receptor positive status [ER+]: Secondary | ICD-10-CM | POA: Diagnosis not present

## 2021-08-30 DIAGNOSIS — C50311 Malignant neoplasm of lower-inner quadrant of right female breast: Secondary | ICD-10-CM | POA: Diagnosis not present

## 2021-09-21 DIAGNOSIS — Z170 Estrogen receptor positive status [ER+]: Secondary | ICD-10-CM | POA: Diagnosis not present

## 2021-09-21 DIAGNOSIS — Z9013 Acquired absence of bilateral breasts and nipples: Secondary | ICD-10-CM | POA: Diagnosis not present

## 2021-09-21 DIAGNOSIS — Z428 Encounter for other plastic and reconstructive surgery following medical procedure or healed injury: Secondary | ICD-10-CM | POA: Diagnosis not present

## 2021-09-21 DIAGNOSIS — K769 Liver disease, unspecified: Secondary | ICD-10-CM | POA: Diagnosis not present

## 2021-09-21 DIAGNOSIS — C50311 Malignant neoplasm of lower-inner quadrant of right female breast: Secondary | ICD-10-CM | POA: Diagnosis not present

## 2021-09-21 DIAGNOSIS — R911 Solitary pulmonary nodule: Secondary | ICD-10-CM | POA: Diagnosis not present

## 2021-09-21 DIAGNOSIS — G620 Drug-induced polyneuropathy: Secondary | ICD-10-CM | POA: Diagnosis not present

## 2021-09-21 DIAGNOSIS — Z791 Long term (current) use of non-steroidal anti-inflammatories (NSAID): Secondary | ICD-10-CM | POA: Diagnosis not present

## 2021-11-01 DIAGNOSIS — Z170 Estrogen receptor positive status [ER+]: Secondary | ICD-10-CM | POA: Diagnosis not present

## 2021-11-01 DIAGNOSIS — I429 Cardiomyopathy, unspecified: Secondary | ICD-10-CM | POA: Diagnosis not present

## 2021-11-01 DIAGNOSIS — C50311 Malignant neoplasm of lower-inner quadrant of right female breast: Secondary | ICD-10-CM | POA: Diagnosis not present

## 2021-11-01 DIAGNOSIS — R002 Palpitations: Secondary | ICD-10-CM | POA: Diagnosis not present
